# Patient Record
Sex: Female | Born: 1955 | Race: White | Hispanic: No | Smoking: Never smoker
Health system: Southern US, Community
[De-identification: ages and names within clinical notes are randomized; demographics above are authoritative.]

## PROBLEM LIST (undated history)

## (undated) DIAGNOSIS — F419 Anxiety disorder, unspecified: Secondary | ICD-10-CM

## (undated) DIAGNOSIS — T8859XA Other complications of anesthesia, initial encounter: Secondary | ICD-10-CM

## (undated) DIAGNOSIS — R112 Nausea with vomiting, unspecified: Secondary | ICD-10-CM

## (undated) DIAGNOSIS — Z8489 Family history of other specified conditions: Secondary | ICD-10-CM

## (undated) DIAGNOSIS — I10 Essential (primary) hypertension: Secondary | ICD-10-CM

## (undated) DIAGNOSIS — E119 Type 2 diabetes mellitus without complications: Secondary | ICD-10-CM

## (undated) DIAGNOSIS — Z9889 Other specified postprocedural states: Secondary | ICD-10-CM

## (undated) DIAGNOSIS — C801 Malignant (primary) neoplasm, unspecified: Secondary | ICD-10-CM

## (undated) DIAGNOSIS — D649 Anemia, unspecified: Secondary | ICD-10-CM

## (undated) DIAGNOSIS — M1990 Unspecified osteoarthritis, unspecified site: Secondary | ICD-10-CM

## (undated) HISTORY — PX: TUBAL LIGATION: SHX77

## (undated) HISTORY — PX: CHOLECYSTECTOMY: SHX55

## (undated) HISTORY — PX: ABDOMINAL HYSTERECTOMY: SHX81

---

## 2014-10-16 ENCOUNTER — Emergency Department
Admission: EM | Admit: 2014-10-16 | Discharge: 2014-10-16 | Disposition: A | Discharge status: Home / Self Care | Payer: Self-pay | Attending: Student | Admitting: Student

## 2014-10-16 ENCOUNTER — Emergency Department: Payer: Self-pay

## 2014-10-16 ENCOUNTER — Encounter: Payer: Self-pay | Admitting: Emergency Medicine

## 2014-10-16 DIAGNOSIS — Z7982 Long term (current) use of aspirin: Secondary | ICD-10-CM | POA: Insufficient documentation

## 2014-10-16 DIAGNOSIS — M10072 Idiopathic gout, left ankle and foot: Secondary | ICD-10-CM | POA: Insufficient documentation

## 2014-10-16 LAB — CBC WITH DIFFERENTIAL/PLATELET
BASOS ABS: 0.1 10*3/uL (ref 0–0.1)
BASOS PCT: 1 %
Eosinophils Absolute: 0.4 10*3/uL (ref 0–0.7)
Eosinophils Relative: 5 %
HCT: 39 % (ref 35.0–47.0)
HEMOGLOBIN: 12.5 g/dL (ref 12.0–16.0)
LYMPHS PCT: 29 %
Lymphs Abs: 2.1 10*3/uL (ref 1.0–3.6)
MCH: 27.8 pg (ref 26.0–34.0)
MCHC: 31.9 g/dL — ABNORMAL LOW (ref 32.0–36.0)
MCV: 87 fL (ref 80.0–100.0)
Monocytes Absolute: 0.5 10*3/uL (ref 0.2–0.9)
Monocytes Relative: 7 %
Neutro Abs: 4.1 10*3/uL (ref 1.4–6.5)
Neutrophils Relative %: 58 %
Platelets: 252 10*3/uL (ref 150–440)
RBC: 4.48 MIL/uL (ref 3.80–5.20)
RDW: 14.6 % — ABNORMAL HIGH (ref 11.5–14.5)
WBC: 7.1 10*3/uL (ref 3.6–11.0)

## 2014-10-16 LAB — BASIC METABOLIC PANEL
ANION GAP: 9 (ref 5–15)
BUN: 18 mg/dL (ref 6–20)
CHLORIDE: 103 mmol/L (ref 101–111)
CO2: 30 mmol/L (ref 22–32)
Calcium: 8.9 mg/dL (ref 8.9–10.3)
Creatinine, Ser: 0.83 mg/dL (ref 0.44–1.00)
GFR calc non Af Amer: >60 mL/min (ref >60)
GLUCOSE: 133 mg/dL — AB (ref 65–99)
Potassium: 4.1 mmol/L (ref 3.5–5.1)
Sodium: 142 mmol/L (ref 135–145)

## 2014-10-16 LAB — URIC ACID: Uric Acid, Serum: 9.3 mg/dL — ABNORMAL HIGH (ref 2.3–6.6)

## 2014-10-16 MED ORDER — COLCHICINE 0.6 MG PO TABS: 0.6000 mg | ORAL_TABLET | Freq: Two times a day (BID) | ORAL | Status: DC

## 2014-10-16 MED ORDER — INDOMETHACIN 50 MG PO CAPS
50.0000 mg | ORAL_CAPSULE | Freq: Two times a day (BID) | ORAL | Status: AC
Start: 2014-10-16 — End: 2014-10-20

## 2014-10-16 MED ORDER — HYDROCODONE-ACETAMINOPHEN 5-325 MG PO TABS
1.0000 | ORAL_TABLET | Freq: Once | ORAL | Status: AC
  Administered 2014-10-16: 1 via ORAL

## 2014-10-16 MED ORDER — HYDROCODONE-ACETAMINOPHEN 5-325 MG PO TABS
ORAL_TABLET | ORAL | Status: AC
  Filled 2014-10-16: qty 1

## 2014-10-16 MED ORDER — KETOROLAC TROMETHAMINE 10 MG PO TABS
ORAL_TABLET | ORAL | Status: AC
  Filled 2014-10-16: qty 2

## 2014-10-16 MED ORDER — TRAMADOL HCL 50 MG PO TABS
ORAL_TABLET | ORAL | Status: AC
  Filled 2014-10-16: qty 1

## 2014-10-16 MED ORDER — TRAMADOL HCL 50 MG PO TABS
50.0000 mg | ORAL_TABLET | Freq: Once | ORAL | Status: AC
  Administered 2014-10-16: 50 mg via ORAL

## 2014-10-16 MED ORDER — KETOROLAC TROMETHAMINE 10 MG PO TABS
20.0000 mg | ORAL_TABLET | Freq: Once | ORAL | Status: AC
  Administered 2014-10-16: 20 mg via ORAL

## 2014-10-16 NOTE — ED Provider Notes (Signed)
[facility] Emergency Department Provider Note  ____________________________________________  Time seen: Approximately 9:09 PM  I have reviewed the triage vital signs and the nursing notes.   HISTORY  Chief Complaint Foot Pain and Ankle Pain    HPI Felicia Lane is a 59 y.o. female complaining the left foot and ankle pain for 3-4 days without any provocative incident. Patient stated the pain has increased over the last 2 days along with the swelling. Initially the pain was controlled with naproxen but today that did not work. She denies any fever or chills to this complaint.   History reviewed. No pertinent past medical history.  There are no active problems to display for this patient.   Past Surgical History  Procedure Laterality Date  . Abdominal hysterectomy    . Cholecystectomy      Current Outpatient Rx  Name  Route  Sig  Dispense  Refill  . aspirin 81 MG tablet   Oral   Take 81 mg by mouth daily.           Allergies Codeine  No family history on file.  Social History History  Substance Use Topics  . Smoking status: Never Smoker   . Smokeless tobacco: Never Used  . Alcohol Use: Yes     Comment: occasionally    Review of Systems  Constitutional: No fever/chills Eyes: No visual changes. ENT: No sore throat. Cardiovascular: Denies chest pain. Respiratory: Denies shortness of breath. Gastrointestinal: No abdominal pain.  No nausea, no vomiting.  No diarrhea.  No constipation. Genitourinary: Negative for dysuria. Musculoskeletal: Positive for ankle and foot pain stayed most of the pain is in the ball of the left foot. Skin: Negative for rash. Positive for edema  10-point ROS otherwise negative.  ____________________________________________   PHYSICAL EXAM:  VITAL SIGNS: ED Triage Vitals  Enc Vitals Group     BP 10/16/14 1929 156/84 mmHg     Pulse Rate 10/16/14 1929 79     Resp 10/16/14 1929 18     Temp 10/16/14  1929 98.4 F (36.9 C)     Temp Source 10/16/14 1929 Oral     SpO2 10/16/14 1929 96 %     Weight 10/16/14 1929 280 lb (127.007 kg)     Height 10/16/14 1929 5\' 6"  (1.676 m)     Head Cir --      Peak Flow --      Pain Score 10/16/14 1930 10     Pain Loc --      Pain Edu? --      Excl. in [place]? --     Constitutional: Alert and oriented. Well appearing and in moderate distress distress. Eyes: Conjunctivae are normal. PERRL. EOMI. Head: Atraumatic. Nose: No congestion/rhinnorhea. Mouth/Throat: Mucous membranes are moist.  Oropharynx non-erythematous. Neck: No stridor.  Full range of motion of the neck Lymphatic/Immunilogical: No cervical lymphadenopathy. Cardiovascular: Normal rate, regular rhythm. Grossly normal heart sounds.  Good peripheral circulation. Blood pressure slightly elevated Respiratory: Normal respiratory effort.  No retractions. Lungs CTAB. Gastrointestinal: Soft and nontender. No distention. No abdominal bruits. No CVA tenderness. Musculoskeletal: Left lower lower extremity tenderness and edema confined to the ankle and foot  No joint effusions. Neurologic:  Normal speech and language. No gross focal neurologic deficits are appreciated. Speech is normal. No gait instability. Skin:  Skin is warm, dry and intact. No rash noted. Psychiatric: Mood and affect are normal. Speech and behavior are normal.  ____________________________________________   LABS (all labs ordered are  listed, but only abnormal results are displayed)  Labs Reviewed  CBC WITH DIFFERENTIAL/PLATELET - Abnormal; Notable for the following:    MCHC 31.9 (*)    RDW 14.6 (*)    All other components within normal limits  BASIC METABOLIC PANEL - Abnormal; Notable for the following:    Glucose, Bld 133 (*)    All other components within normal limits  URIC ACID - Abnormal; Notable for the following:    Uric Acid, Serum 9.3 (*)    All other components within normal limits   _______________ Labs Reviewed   CBC WITH DIFFERENTIAL/PLATELET - Abnormal; Notable for the following:    MCHC 31.9 (*)    RDW 14.6 (*)    All other components within normal limits  BASIC METABOLIC PANEL - Abnormal; Notable for the following:    Glucose, Bld 133 (*)    All other components within normal limits  URIC ACID - Abnormal; Notable for the following:    Uric Acid, Serum 9.3 (*)    All other components within normal limits   _____________________________  EKG   ____________________________________________  RADIOLOGY  No acute findings ____________________________________________   PROCEDURES  Procedure(s) performed: None  Critical Care performed: No  ____________________________________________   INITIAL IMPRESSION / ASSESSMENT AND PLAN / ED COURSE  Pertinent labs & imaging results that were available during my care of the patient were reviewed by me and considered in my medical decision making (see chart for details).  Left foot pain ____________________________________________   FINAL CLINICAL IMPRESSION(S) / ED DIAGNOSES  Final diagnoses:  Acute idiopathic gout of left ankle      Sable Feil, PA-C 10/16/14 2251

## 2014-10-16 NOTE — ED Notes (Signed)
Pt c/o left ankle and foot pain for 3-4 days; denies injury; pt is concerned she has gout

## 2014-10-16 NOTE — Discharge Instructions (Signed)
Gout °Gout is when your joints become red, sore, and swell (inflamed). This is caused by the buildup of uric acid crystals in the joints. Uric acid is a chemical that is normally in the blood. If the level of uric acid gets too high in the blood, these crystals form in your joints and tissues. Over time, these crystals can form into masses near the joints and tissues. These masses can destroy bone and cause the bone to look misshapen (deformed). °HOME CARE  °· Do not take aspirin for pain. °· Only take medicine as told by your doctor. °· Rest the joint as much as you can. When in bed, keep sheets and blankets off painful areas. °· Keep the sore joints raised (elevated). °· Put warm or cold packs on painful joints. Use of warm or cold packs depends on which works best for you. °· Use crutches if the painful joint is in your leg. °· Drink enough fluids to keep your pee (urine) clear or pale yellow. Limit alcohol, sugary drinks, and drinks with fructose in them. °· Follow your diet instructions. Pay careful attention to how much protein you eat. Include fruits, vegetables, whole grains, and fat-free or low-fat milk products in your daily diet. Talk to your doctor or dietitian about the use of coffee, vitamin C, and cherries. These may help lower uric acid levels. °· Keep a healthy body weight. °GET HELP RIGHT AWAY IF:  °· You have watery poop (diarrhea), throw up (vomit), or have any side effects from medicines. °· You do not feel better in 24 hours, or you are getting worse. °· Your joint becomes suddenly more tender, and you have chills or a fever. °MAKE SURE YOU:  °· Understand these instructions. °· Will watch your condition. °· Will get help right away if you are not doing well or get worse. °Document Released: 02/27/2008 Document Revised: 10/04/2013 Document Reviewed: 01/01/2012 °ExitCare® Patient Information ©2015 [employer]. This information is not intended to replace advice given to you by your health care  provider. Make sure you discuss any questions you have with your health care provider. ° °

## 2014-10-20 ENCOUNTER — Ambulatory Visit: Payer: Self-pay

## 2014-11-24 ENCOUNTER — Ambulatory Visit: Payer: Self-pay

## 2014-12-01 ENCOUNTER — Other Ambulatory Visit: Payer: Self-pay

## 2014-12-15 ENCOUNTER — Ambulatory Visit: Payer: Self-pay | Admitting: Ophthalmology

## 2014-12-15 ENCOUNTER — Ambulatory Visit: Payer: Self-pay

## 2015-01-17 ENCOUNTER — Ambulatory Visit: Payer: Self-pay

## 2015-01-19 ENCOUNTER — Other Ambulatory Visit: Payer: Self-pay

## 2015-03-23 ENCOUNTER — Other Ambulatory Visit: Payer: Self-pay

## 2015-03-30 ENCOUNTER — Ambulatory Visit: Payer: Self-pay

## 2015-12-21 ENCOUNTER — Ambulatory Visit: Payer: Self-pay | Admitting: Ophthalmology

## 2018-07-08 ENCOUNTER — Ambulatory Visit: Payer: Self-pay | Attending: Oncology | Admitting: *Deleted

## 2018-07-08 ENCOUNTER — Ambulatory Visit
Admission: RE | Admit: 2018-07-08 | Discharge: 2018-07-08 | Disposition: A | Discharge status: Home / Self Care | Payer: Self-pay | Source: Ambulatory Visit | Attending: Oncology | Admitting: Oncology

## 2018-07-08 ENCOUNTER — Encounter (INDEPENDENT_AMBULATORY_CARE_PROVIDER_SITE_OTHER): Payer: Self-pay

## 2018-07-08 ENCOUNTER — Encounter: Payer: Self-pay | Admitting: *Deleted

## 2018-07-08 VITALS — BP 149/80 | HR 77 | Temp 98.4°F | Ht 66.75 in | Wt 281.4 lb

## 2018-07-08 DIAGNOSIS — Z0000 Encounter for general adult medical examination without abnormal findings: Secondary | ICD-10-CM

## 2018-07-08 NOTE — Patient Instructions (Signed)
Gave patient hand-out, Women Staying Healthy, Active and Well from [facility], with education on breast health, pap smears, heart and colon health. 

## 2018-07-08 NOTE — Progress Notes (Signed)
  Subjective:     Patient ID: Felicia Lane, female   DOB: [birth year], 63 y.o.   MRN: [id number]  HPI   Review of Systems     Objective:   Physical Exam Chest:     Breasts:        Right: No swelling, bleeding, inverted nipple, mass, nipple discharge, skin change or tenderness.        Left: No bleeding, inverted nipple, mass, nipple discharge, skin change or tenderness.  Abdominal:     Palpations: There is no hepatomegaly or splenomegaly.    Genitourinary:    Exam position: Lithotomy position.     Labia:        Right: No rash, tenderness, lesion or injury.        Left: No rash, tenderness, lesion or injury.      Urethra: No urethral lesion.     Uterus: Absent.   Lymphadenopathy:     Upper Body:     Right upper body: No supraclavicular or axillary adenopathy.     Left upper body: No supraclavicular adenopathy.        Assessment:     63 year old White female presents to [facility] for clinical breast exam and mammogram.  States she has not had a mammogram since 1995.  Clinical breast exam is unremarkable.  Taught self breast awareness.  States she has had a hysterectomy for heavy bleeding and clotting.  She does not know if she has a cervix.  On examination of the vagina and pelvis there is no cervix visualized or palpated.  States she does still have both ovaries.  Patient has been screened for eligibility.  She does not have any insurance, Medicare or Medicaid.  She also meets financial eligibility.  Hand-out given on the Affordable Care Act.  Risk Assessment    Risk Scores      07/08/2018   Last edited by: Orson Slick, CMA   5-year risk: 1.3 %   Lifetime risk: 5.2 %            Plan:     Screening mammogram ordered.  Will follow-up per [facility] protocol.

## 2018-07-20 ENCOUNTER — Encounter: Payer: Self-pay | Admitting: *Deleted

## 2018-07-20 NOTE — Progress Notes (Unsigned)
Letter mailed from the [facility] to inform patient of her normal mammogram results.  Patient is to follow-up with annual screening in one year.  HSIS to Christy. 

## 2019-01-01 ENCOUNTER — Other Ambulatory Visit: Payer: Self-pay | Admitting: Family Medicine

## 2019-01-01 DIAGNOSIS — Z0000 Encounter for general adult medical examination without abnormal findings: Secondary | ICD-10-CM

## 2019-01-27 ENCOUNTER — Ambulatory Visit
Admission: RE | Admit: 2019-01-27 | Discharge: 2019-01-27 | Disposition: A | Discharge status: Home / Self Care | Payer: Medicare HMO | Source: Ambulatory Visit | Attending: Family Medicine | Admitting: Family Medicine

## 2019-01-27 DIAGNOSIS — M8588 Other specified disorders of bone density and structure, other site: Secondary | ICD-10-CM | POA: Insufficient documentation

## 2019-01-27 DIAGNOSIS — E119 Type 2 diabetes mellitus without complications: Secondary | ICD-10-CM | POA: Insufficient documentation

## 2019-01-27 DIAGNOSIS — I10 Essential (primary) hypertension: Secondary | ICD-10-CM | POA: Insufficient documentation

## 2019-01-27 DIAGNOSIS — Z0000 Encounter for general adult medical examination without abnormal findings: Secondary | ICD-10-CM | POA: Insufficient documentation

## 2019-12-31 DIAGNOSIS — M19011 Primary osteoarthritis, right shoulder: Secondary | ICD-10-CM | POA: Insufficient documentation

## 2020-01-03 ENCOUNTER — Other Ambulatory Visit: Payer: Self-pay | Admitting: Surgery

## 2020-01-03 DIAGNOSIS — M19011 Primary osteoarthritis, right shoulder: Secondary | ICD-10-CM

## 2020-01-05 ENCOUNTER — Ambulatory Visit
Admission: RE | Admit: 2020-01-05 | Discharge: 2020-01-05 | Disposition: A | Discharge status: Home / Self Care | Payer: Medicare HMO | Source: Ambulatory Visit | Attending: Surgery | Admitting: Surgery

## 2020-01-05 ENCOUNTER — Other Ambulatory Visit: Payer: Self-pay

## 2020-01-05 DIAGNOSIS — M19011 Primary osteoarthritis, right shoulder: Secondary | ICD-10-CM | POA: Diagnosis present

## 2020-01-26 ENCOUNTER — Other Ambulatory Visit: Payer: Self-pay | Admitting: Family Medicine

## 2020-01-26 DIAGNOSIS — Z1231 Encounter for screening mammogram for malignant neoplasm of breast: Secondary | ICD-10-CM

## 2020-02-16 ENCOUNTER — Other Ambulatory Visit: Payer: Self-pay | Admitting: Surgery

## 2020-02-25 ENCOUNTER — Encounter
Admission: RE | Admit: 2020-02-25 | Discharge: 2020-02-25 | Disposition: A | Discharge status: Home / Self Care | Payer: Medicare HMO | Source: Ambulatory Visit | Attending: Surgery | Admitting: Surgery

## 2020-02-25 ENCOUNTER — Other Ambulatory Visit: Payer: Self-pay

## 2020-02-25 DIAGNOSIS — Z01818 Encounter for other preprocedural examination: Secondary | ICD-10-CM | POA: Insufficient documentation

## 2020-02-25 DIAGNOSIS — E119 Type 2 diabetes mellitus without complications: Secondary | ICD-10-CM | POA: Insufficient documentation

## 2020-02-25 DIAGNOSIS — I10 Essential (primary) hypertension: Secondary | ICD-10-CM | POA: Insufficient documentation

## 2020-02-25 HISTORY — DX: Family history of other specified conditions: Z84.89

## 2020-02-25 HISTORY — DX: Type 2 diabetes mellitus without complications: E11.9

## 2020-02-25 HISTORY — DX: Unspecified osteoarthritis, unspecified site: M19.90

## 2020-02-25 HISTORY — DX: Malignant (primary) neoplasm, unspecified: C80.1

## 2020-02-25 HISTORY — DX: Anxiety disorder, unspecified: F41.9

## 2020-02-25 HISTORY — DX: Anemia, unspecified: D64.9

## 2020-02-25 HISTORY — DX: Nausea with vomiting, unspecified: R11.2

## 2020-02-25 HISTORY — DX: Other specified postprocedural states: Z98.890

## 2020-02-25 HISTORY — DX: Other complications of anesthesia, initial encounter: T88.59XA

## 2020-02-25 HISTORY — DX: Essential (primary) hypertension: I10

## 2020-02-25 LAB — BASIC METABOLIC PANEL
Anion gap: 10 (ref 5–15)
BUN: 41 mg/dL — ABNORMAL HIGH (ref 8–23)
CO2: 25 mmol/L (ref 22–32)
Calcium: 9 mg/dL (ref 8.9–10.3)
Chloride: 105 mmol/L (ref 98–111)
Creatinine, Ser: 1.81 mg/dL — ABNORMAL HIGH (ref 0.44–1.00)
GFR calc Af Amer: 33 mL/min — ABNORMAL LOW (ref >60)
GFR calc non Af Amer: 29 mL/min — ABNORMAL LOW (ref >60)
Glucose, Bld: 124 mg/dL — ABNORMAL HIGH (ref 70–99)
Potassium: 4.6 mmol/L (ref 3.5–5.1)
Sodium: 140 mmol/L (ref 135–145)

## 2020-02-25 LAB — CBC
HCT: 32.8 % — ABNORMAL LOW (ref 36.0–46.0)
Hemoglobin: 10.2 g/dL — ABNORMAL LOW (ref 12.0–15.0)
MCH: 29.3 pg (ref 26.0–34.0)
MCHC: 31.1 g/dL (ref 30.0–36.0)
MCV: 94.3 fL (ref 80.0–100.0)
Platelets: 264 10*3/uL (ref 150–400)
RBC: 3.48 MIL/uL — ABNORMAL LOW (ref 3.87–5.11)
RDW: 14.7 % (ref 11.5–15.5)
WBC: 11.3 10*3/uL — ABNORMAL HIGH (ref 4.0–10.5)
nRBC: 0 % (ref 0.0–0.2)

## 2020-02-25 LAB — TYPE AND SCREEN
ABO/RH(D): A POS
Antibody Screen: NEGATIVE

## 2020-02-25 LAB — SURGICAL PCR SCREEN
MRSA, PCR: NEGATIVE
Staphylococcus aureus: NEGATIVE

## 2020-02-25 LAB — HEMOGLOBIN A1C
Hgb A1c MFr Bld: 7 % — ABNORMAL HIGH (ref 4.8–5.6)
Mean Plasma Glucose: 154.2 mg/dL

## 2020-02-25 NOTE — Patient Instructions (Addendum)
Your procedure is scheduled on:03-02-20 THURSDAY Report to Day Surgery on the 2nd floor of the [facility]. To find out your arrival time, please call [phone] between 1PM - 3PM on:03-01-20 WEDNESDAY  REMEMBER: Instructions that are not followed completely may result in serious medical risk, up to and including death; or upon the discretion of your surgeon and anesthesiologist your surgery may need to be rescheduled.  Do not eat food after midnight the night before surgery.  No gum chewing, lozengers or hard candies.  You may however, drink WATER up to 2 hours before you are scheduled to arrive for your surgery. Do not drink anything within 2 hours of your scheduled arrival time.  Type 1 and Type 2 diabetics should only drink water.  In addition, your doctor has ordered for you to drink the provided  Gatorade G2 Drinking this carbohydrate drink up to two hours before surgery helps to reduce insulin resistance and improve patient outcomes. Please complete drinking 2 hours prior to scheduled arrival time.  TAKE THESE MEDICATIONS THE MORNING OF SURGERY WITH A SIP OF WATER: -GABAPENTIN (NEURONTIN)  Stop Metformin 2 days prior to surgery-LAST DOSE ON 02-28-20 MONDAY  Follow recommendations from Cardiologist, Pulmonologist or PCP regarding stopping Aspirin, Coumadin, Plavix, Eliquis, Pradaxa, or Pletal-STOP YOUR ASPIRIN NOW  One week prior to surgery: Stop Anti-inflammatories (NSAIDS) such as MOBIC (MELOXICAM) Advil, Aleve, Ibuprofen, Motrin, Naproxen, Naprosyn and Aspirin based products such as Excedrin, Goodys Powder, BC Powder.  Stop ANY OVER THE COUNTER supplements until after surgery. (You may continue taking Tylenol, Vitamin D, Vitamin B, and multivitamin.)-STOP YOUR GLUCOSAMINE-CHONDROITIN AND FISH OIL NOW-YOU  MAY RESUME AFTER SURGERY  No Alcohol for 24 hours before or after surgery.  No Smoking including e-cigarettes for 24 hours prior to surgery.  No chewable tobacco  products for at least 6 hours prior to surgery.  No nicotine patches on the day of surgery.  Do not use any "recreational" drugs for at least a week prior to your surgery.  Please be advised that the combination of cocaine and anesthesia may have negative outcomes, up to and including death. If you test positive for cocaine, your surgery will be cancelled.  On the morning of surgery brush your teeth with toothpaste and water, you may rinse your mouth with mouthwash if you wish. Do not swallow any toothpaste or mouthwash.  Do not wear jewelry, make-up, hairpins, clips or nail polish.  Do not wear lotions, powders, or perfumes.   Do not shave 48 hours prior to surgery.   Contact lenses, hearing aids and dentures may not be worn into surgery.  Do not bring valuables to the hospital. [facility] is not responsible for any missing/lost belongings or valuables.   Use CHG Soap or wipes as directed on instruction sheet.  Total Shoulder Arthroplasty:  use Benzolyl Peroxide 5% Gel as directed on instruction sheet.  Notify your doctor if there is any change in your medical condition (cold, fever, infection).  Wear comfortable clothing (specific to your surgery type) to the hospital.  Plan for stool softeners for home use; pain medications have a tendency to cause constipation. You can also help prevent constipation by eating foods high in fiber such as fruits and vegetables and drinking plenty of fluids as your diet allows.  After surgery, you can help prevent lung complications by doing breathing exercises.  Take deep breaths and cough every 1-2 hours. Your doctor may order a device called an Incentive Spirometer to help you take  deep breaths. When coughing or sneezing, hold a pillow firmly against your incision with both hands. This is called splinting. Doing this helps protect your incision. It also decreases belly discomfort.  If you are being admitted to the hospital overnight, leave  your suitcase in the car. After surgery it may be brought to your room.  If you are being discharged the day of surgery, you will not be allowed to drive home. You will need a responsible adult (18 years or older) to drive you home and stay with you that night.   If you are taking public transportation, you will need to have a responsible adult (18 years or older) with you. Please confirm with your physician that it is acceptable to use public transportation.   Please call the [facility] at [phone] if you have any questions about these instructions.  Visitation Policy:  Patients undergoing a surgery or procedure may have one family member or support person with them as long as that person is not COVID-19 positive or experiencing its symptoms.  That person may remain in the waiting area during the procedure.  Inpatient Visitation Update:   In an effort to ensure the safety of our team members and our patients, we are implementing a change to our visitation policy:  Effective Monday, Aug. 9, at 7 a.m., inpatients will be allowed one support person.  o The support person may change daily.  o The support person must pass our screening, gel in and out, and wear a mask at all times, including in the patients room.  o Patients must also wear a mask when staff or their support person are in the room.  o Masking is required regardless of vaccination status.  Systemwide, no visitors 17 or younger.

## 2020-02-25 NOTE — Progress Notes (Signed)
  [facility] Perioperative Services: Pre-Admission/Anesthesia Testing  Abnormal Lab Notification   Date: 02/25/20  Name: Felicia Lane MRN:   [id number]  Re: Abnormal labs noted during PAT appointment   Provider(s) Notified: Poggi, Marshall Cork, MD and Cameron Proud, PA-C Notification mode: Routed and/or faxed via [place] LAB VALUE(S): Lab Results  Component Value Date   HGB 10.2 (L) 02/25/2020   HCT 32.8 (L) 02/25/2020   BUN 41 (H) 02/25/2020   CREATININE 1.81 (H) 02/25/2020    Notes: Patient is on daily thiazide (HCTZ) and loop (furosemide) diuretics. Patient also with a T2DM diagnosis. Last glucose on record was 247 mg/dL in 02/2018. She is currently on oral (glipizide, metformin, and sitagliptin) therapy.  I do not have any available Hgb A1c results for review. Will add an A1c to today's PAT labs. In the event that the result is elevated, I will forward the result to you and her PCP for review. With her HTN and T2DM, patient is on a renal protective ACEi (lisinopril). This communication is being sent in order to determine if patient is deemed to have adequate preoperative glycemic control in efforts to reduce her risk of developing SSI/PJI.   Marland Kitchen The odds ratio for SSI/PJI infection is between 2.8 and 3.4 for orthopedic surgery patients with pre-operative serum glucose levels of > 125 mg/dL or a post-operative levels of > 200 mg/dL ([place], 2019).    . Data suggests that a Hgb A1c threshold of 7.7% tends to be more indicative of infection than the commonly used 7% and should perhaps be the pre-operative patient optimization goal (Tarabichi et al., 2017).   With that being said, the benefit of improving glycemic control must be weighed against the overall risk associated with delaying a necessary elective orthopedic surgery for this patient. This is a Community education officer; no formal response is required.  Citations: Charlett Blake, A.F. Reducing the  risk of infection after total joint arthroplasty: preoperative optimization. Arthroplasty 1, 4 (2019). [email or web address]  Lorrin Goodell MM, [place], Brigati D, [place] SM, [address] [place] JC, [place], [place], [place], Parvizi Lenna Sciara Peebles. Determining the Threshold for HbA1c as a Predictor for Adverse Outcomes After Total Joint Arthroplasty: A Multicenter, Retrospective Study. J Arthroplasty. 2017 Sep;32(9S):S263-S267.e1. [email or web address].   Honor Loh, MSN, APRN, FNP-C, CEN [facility]  Peri-operative Services Nurse Practitioner Phone: [phone] 02/25/20 2:59 PM

## 2020-02-29 ENCOUNTER — Other Ambulatory Visit: Payer: Self-pay

## 2020-02-29 ENCOUNTER — Other Ambulatory Visit
Admission: RE | Admit: 2020-02-29 | Discharge: 2020-02-29 | Disposition: A | Discharge status: Home / Self Care | Payer: Medicare HMO | Source: Ambulatory Visit | Attending: Surgery | Admitting: Surgery

## 2020-02-29 DIAGNOSIS — Z20822 Contact with and (suspected) exposure to covid-19: Secondary | ICD-10-CM | POA: Diagnosis not present

## 2020-02-29 DIAGNOSIS — Z01812 Encounter for preprocedural laboratory examination: Secondary | ICD-10-CM | POA: Insufficient documentation

## 2020-02-29 LAB — SARS CORONAVIRUS 2 (TAT 6-24 HRS): SARS Coronavirus 2: NEGATIVE

## 2020-03-01 ENCOUNTER — Encounter: Payer: Self-pay | Admitting: Surgery

## 2020-03-02 ENCOUNTER — Ambulatory Visit: Payer: Medicare HMO

## 2020-03-02 ENCOUNTER — Other Ambulatory Visit: Payer: Self-pay

## 2020-03-02 ENCOUNTER — Encounter
Admission: RE | Disposition: A | Discharge status: Home / Self Care | Payer: Self-pay | Source: Home / Self Care | Attending: Surgery

## 2020-03-02 ENCOUNTER — Ambulatory Visit: Payer: Medicare HMO | Admitting: Certified Registered"

## 2020-03-02 ENCOUNTER — Ambulatory Visit
Admission: RE | Admit: 2020-03-02 | Discharge: 2020-03-02 | Disposition: A | Discharge status: Home / Self Care | Payer: Medicare HMO | Attending: Surgery | Admitting: Surgery

## 2020-03-02 ENCOUNTER — Encounter: Payer: Self-pay | Admitting: Surgery

## 2020-03-02 DIAGNOSIS — E669 Obesity, unspecified: Secondary | ICD-10-CM | POA: Insufficient documentation

## 2020-03-02 DIAGNOSIS — M75121 Complete rotator cuff tear or rupture of right shoulder, not specified as traumatic: Secondary | ICD-10-CM | POA: Diagnosis not present

## 2020-03-02 DIAGNOSIS — Z7984 Long term (current) use of oral hypoglycemic drugs: Secondary | ICD-10-CM | POA: Diagnosis not present

## 2020-03-02 DIAGNOSIS — Z7982 Long term (current) use of aspirin: Secondary | ICD-10-CM | POA: Insufficient documentation

## 2020-03-02 DIAGNOSIS — Z596 Low income: Secondary | ICD-10-CM | POA: Diagnosis not present

## 2020-03-02 DIAGNOSIS — Z6841 Body Mass Index (BMI) 40.0 and over, adult: Secondary | ICD-10-CM | POA: Diagnosis not present

## 2020-03-02 DIAGNOSIS — Z791 Long term (current) use of non-steroidal anti-inflammatories (NSAID): Secondary | ICD-10-CM | POA: Insufficient documentation

## 2020-03-02 DIAGNOSIS — Z885 Allergy status to narcotic agent status: Secondary | ICD-10-CM | POA: Diagnosis not present

## 2020-03-02 DIAGNOSIS — Z79899 Other long term (current) drug therapy: Secondary | ICD-10-CM | POA: Insufficient documentation

## 2020-03-02 DIAGNOSIS — M19011 Primary osteoarthritis, right shoulder: Secondary | ICD-10-CM | POA: Diagnosis present

## 2020-03-02 DIAGNOSIS — Z96611 Presence of right artificial shoulder joint: Secondary | ICD-10-CM

## 2020-03-02 DIAGNOSIS — Z01812 Encounter for preprocedural laboratory examination: Secondary | ICD-10-CM

## 2020-03-02 HISTORY — PX: REVERSE SHOULDER ARTHROPLASTY: SHX5054

## 2020-03-02 LAB — ABO/RH: ABO/RH(D): A POS

## 2020-03-02 LAB — GLUCOSE, CAPILLARY
Glucose-Capillary: 175 mg/dL — ABNORMAL HIGH (ref 70–99)
Glucose-Capillary: 190 mg/dL — ABNORMAL HIGH (ref 70–99)

## 2020-03-02 SURGERY — ARTHROPLASTY, SHOULDER, TOTAL, REVERSE
Anesthesia: General | Site: Shoulder | Laterality: Right

## 2020-03-02 MED ORDER — BUPIVACAINE-EPINEPHRINE (PF) 0.5% -1:200000 IJ SOLN
INTRAMUSCULAR | Status: DC | PRN
  Administered 2020-03-02: 30 mL

## 2020-03-02 MED ORDER — MIDAZOLAM HCL 2 MG/2ML IJ SOLN
INTRAMUSCULAR | Status: AC
  Administered 2020-03-02: 2 mg via INTRAVENOUS
  Filled 2020-03-02: qty 2

## 2020-03-02 MED ORDER — DEXTROSE 5 % IV SOLN
3.0000 g | INTRAVENOUS | Status: AC
  Administered 2020-03-02: 2 g via INTRAVENOUS
  Filled 2020-03-02: qty 3

## 2020-03-02 MED ORDER — ONDANSETRON HCL 4 MG/2ML IJ SOLN: 4.0000 mg | Freq: Once | INTRAMUSCULAR | Status: DC | PRN

## 2020-03-02 MED ORDER — LIDOCAINE HCL (PF) 2 % IJ SOLN
INTRAMUSCULAR | Status: AC
  Filled 2020-03-02: qty 5

## 2020-03-02 MED ORDER — SODIUM CHLORIDE 0.9 % IV SOLN
INTRAVENOUS | Status: DC | PRN
  Administered 2020-03-02: 50 ug/min via INTRAVENOUS

## 2020-03-02 MED ORDER — BUPIVACAINE HCL (PF) 0.5 % IJ SOLN
INTRAMUSCULAR | Status: AC
  Filled 2020-03-02: qty 30

## 2020-03-02 MED ORDER — CHLORHEXIDINE GLUCONATE 0.12 % MT SOLN: 15.0000 mL | Freq: Once | OROMUCOSAL | Status: AC

## 2020-03-02 MED ORDER — PROPOFOL 10 MG/ML IV BOLUS
INTRAVENOUS | Status: DC | PRN
  Administered 2020-03-02: 50 mg via INTRAVENOUS
  Administered 2020-03-02: 30 mg via INTRAVENOUS
  Administered 2020-03-02: 150 mg via INTRAVENOUS
  Administered 2020-03-02 (×2): 50 mg via INTRAVENOUS

## 2020-03-02 MED ORDER — MIDAZOLAM HCL 2 MG/2ML IJ SOLN
INTRAMUSCULAR | Status: AC
  Filled 2020-03-02: qty 2

## 2020-03-02 MED ORDER — TRANEXAMIC ACID 1000 MG/10ML IV SOLN
INTRAVENOUS | Status: AC
  Filled 2020-03-02: qty 10

## 2020-03-02 MED ORDER — LIDOCAINE HCL (PF) 1 % IJ SOLN
INTRAMUSCULAR | Status: DC | PRN
  Administered 2020-03-02: 5 mL via SUBCUTANEOUS

## 2020-03-02 MED ORDER — FAMOTIDINE 20 MG PO TABS
ORAL_TABLET | ORAL | Status: AC
  Administered 2020-03-02: 20 mg
  Filled 2020-03-02: qty 1

## 2020-03-02 MED ORDER — PHENYLEPHRINE HCL (PRESSORS) 10 MG/ML IV SOLN
INTRAVENOUS | Status: AC
  Filled 2020-03-02: qty 1

## 2020-03-02 MED ORDER — PROPOFOL 500 MG/50ML IV EMUL
INTRAVENOUS | Status: AC
  Filled 2020-03-02: qty 50

## 2020-03-02 MED ORDER — ROCURONIUM BROMIDE 100 MG/10ML IV SOLN
INTRAVENOUS | Status: DC | PRN
  Administered 2020-03-02: 20 mg via INTRAVENOUS
  Administered 2020-03-02: 50 mg via INTRAVENOUS

## 2020-03-02 MED ORDER — ORAL CARE MOUTH RINSE: 15.0000 mL | Freq: Once | OROMUCOSAL | Status: AC

## 2020-03-02 MED ORDER — FENTANYL CITRATE (PF) 100 MCG/2ML IJ SOLN
INTRAMUSCULAR | Status: AC
  Filled 2020-03-02: qty 2

## 2020-03-02 MED ORDER — SUGAMMADEX SODIUM 200 MG/2ML IV SOLN
INTRAVENOUS | Status: DC | PRN
  Administered 2020-03-02: 50 mg via INTRAVENOUS
  Administered 2020-03-02: 150 mg via INTRAVENOUS

## 2020-03-02 MED ORDER — DEXMEDETOMIDINE HCL 200 MCG/2ML IV SOLN
INTRAVENOUS | Status: DC | PRN
  Administered 2020-03-02 (×5): 4 ug via INTRAVENOUS

## 2020-03-02 MED ORDER — KETOROLAC TROMETHAMINE 15 MG/ML IJ SOLN
INTRAMUSCULAR | Status: AC
  Administered 2020-03-02: 15 mg via INTRAVENOUS
  Filled 2020-03-02: qty 1

## 2020-03-02 MED ORDER — BUPIVACAINE LIPOSOME 1.3 % IJ SUSP
INTRAMUSCULAR | Status: AC
  Filled 2020-03-02: qty 20

## 2020-03-02 MED ORDER — LIDOCAINE HCL (PF) 1 % IJ SOLN
INTRAMUSCULAR | Status: AC
  Filled 2020-03-02: qty 5

## 2020-03-02 MED ORDER — LIDOCAINE HCL (CARDIAC) PF 100 MG/5ML IV SOSY
PREFILLED_SYRINGE | INTRAVENOUS | Status: DC | PRN
  Administered 2020-03-02: 100 mg via INTRAVENOUS

## 2020-03-02 MED ORDER — ACETAMINOPHEN 10 MG/ML IV SOLN
INTRAVENOUS | Status: AC
  Filled 2020-03-02: qty 100

## 2020-03-02 MED ORDER — BUPIVACAINE HCL (PF) 0.5 % IJ SOLN
INTRAMUSCULAR | Status: AC
  Filled 2020-03-02: qty 10

## 2020-03-02 MED ORDER — BUPIVACAINE LIPOSOME 1.3 % IJ SUSP
INTRAMUSCULAR | Status: DC | PRN
  Administered 2020-03-02: 20 mL via PERINEURAL

## 2020-03-02 MED ORDER — BUPIVACAINE HCL (PF) 0.5 % IJ SOLN
INTRAMUSCULAR | Status: DC | PRN
  Administered 2020-03-02: 10 mL via PERINEURAL

## 2020-03-02 MED ORDER — PROPOFOL 10 MG/ML IV BOLUS
INTRAVENOUS | Status: AC
  Filled 2020-03-02: qty 40

## 2020-03-02 MED ORDER — FENTANYL CITRATE (PF) 100 MCG/2ML IJ SOLN
INTRAMUSCULAR | Status: DC | PRN
Start: 2020-03-02 — End: 2020-03-02
  Administered 2020-03-02: 50 ug via INTRAVENOUS
  Administered 2020-03-02 (×3): 25 ug via INTRAVENOUS
  Administered 2020-03-02: 50 ug via INTRAVENOUS

## 2020-03-02 MED ORDER — PHENYLEPHRINE HCL (PRESSORS) 10 MG/ML IV SOLN
INTRAVENOUS | Status: DC | PRN
  Administered 2020-03-02: 100 ug via INTRAVENOUS
  Administered 2020-03-02: 50 ug via INTRAVENOUS

## 2020-03-02 MED ORDER — ROCURONIUM BROMIDE 10 MG/ML (PF) SYRINGE
PREFILLED_SYRINGE | INTRAVENOUS | Status: AC
  Filled 2020-03-02: qty 10

## 2020-03-02 MED ORDER — ACETAMINOPHEN 10 MG/ML IV SOLN
INTRAVENOUS | Status: DC | PRN
  Administered 2020-03-02: 1000 mg via INTRAVENOUS

## 2020-03-02 MED ORDER — DEXAMETHASONE SODIUM PHOSPHATE 10 MG/ML IJ SOLN
INTRAMUSCULAR | Status: DC | PRN
  Administered 2020-03-02: 5 mg via INTRAVENOUS

## 2020-03-02 MED ORDER — MIDAZOLAM HCL 2 MG/2ML IJ SOLN: 2.0000 mg | Freq: Once | INTRAMUSCULAR | Status: AC

## 2020-03-02 MED ORDER — MIDAZOLAM HCL 5 MG/5ML IJ SOLN
INTRAMUSCULAR | Status: DC | PRN
  Administered 2020-03-02: 1 mg via INTRAVENOUS

## 2020-03-02 MED ORDER — SUCCINYLCHOLINE CHLORIDE 200 MG/10ML IV SOSY
PREFILLED_SYRINGE | INTRAVENOUS | Status: AC
  Filled 2020-03-02: qty 10

## 2020-03-02 MED ORDER — FENTANYL CITRATE (PF) 100 MCG/2ML IJ SOLN: 25.0000 ug | INTRAMUSCULAR | Status: DC | PRN

## 2020-03-02 MED ORDER — SODIUM CHLORIDE FLUSH 0.9 % IV SOLN
INTRAVENOUS | Status: AC
  Filled 2020-03-02: qty 40

## 2020-03-02 MED ORDER — PROPOFOL 500 MG/50ML IV EMUL
INTRAVENOUS | Status: DC | PRN
  Administered 2020-03-02: 150 ug/kg/min via INTRAVENOUS

## 2020-03-02 MED ORDER — KETOROLAC TROMETHAMINE 30 MG/ML IJ SOLN: 30.0000 mg | Freq: Once | INTRAMUSCULAR | Status: DC

## 2020-03-02 MED ORDER — ONDANSETRON HCL 4 MG/2ML IJ SOLN
INTRAMUSCULAR | Status: DC | PRN
  Administered 2020-03-02: 4 mg via INTRAVENOUS

## 2020-03-02 MED ORDER — OXYCODONE HCL 5 MG PO TABS: 5.0000 mg | ORAL_TABLET | ORAL | 0 refills | Status: DC | PRN

## 2020-03-02 MED ORDER — EPINEPHRINE PF 1 MG/ML IJ SOLN
INTRAMUSCULAR | Status: AC
  Filled 2020-03-02: qty 1

## 2020-03-02 MED ORDER — CHLORHEXIDINE GLUCONATE 0.12 % MT SOLN
OROMUCOSAL | Status: AC
  Administered 2020-03-02: 15 mL via OROMUCOSAL
  Filled 2020-03-02: qty 15

## 2020-03-02 MED ORDER — SODIUM CHLORIDE 0.9 % IV SOLN: INTRAVENOUS | Status: DC

## 2020-03-02 MED ORDER — TRANEXAMIC ACID 1000 MG/10ML IV SOLN
INTRAVENOUS | Status: DC | PRN
  Administered 2020-03-02: 1000 mg via TOPICAL

## 2020-03-02 MED ORDER — ONDANSETRON HCL 4 MG/2ML IJ SOLN
INTRAMUSCULAR | Status: AC
  Filled 2020-03-02: qty 2

## 2020-03-02 MED ORDER — KETOROLAC TROMETHAMINE 15 MG/ML IJ SOLN: 15.0000 mg | Freq: Once | INTRAMUSCULAR | Status: AC

## 2020-03-02 MED ORDER — DEXAMETHASONE SODIUM PHOSPHATE 10 MG/ML IJ SOLN
INTRAMUSCULAR | Status: AC
  Filled 2020-03-02: qty 1

## 2020-03-02 SURGICAL SUPPLY — 70 items
APL PRP STRL LF DISP 70% ISPRP (MISCELLANEOUS) ×1
BASEPLATE GLENOSPHERE 25 (Plate) ×2 IMPLANT
BEARING HUMERAL SHLDER 36M STD (Shoulder) ×1 IMPLANT
BIT DRILL TWIST 2.7 (BIT) ×2 IMPLANT
BLADE SAW SAG 25X90X1.19 (BLADE) ×2 IMPLANT
BRNG HUM STD 36 RVRS SHLDR (Shoulder) ×1 IMPLANT
CANISTER SUCT 1200ML W/VALVE (MISCELLANEOUS) ×2 IMPLANT
CANISTER SUCT 3000ML PPV (MISCELLANEOUS) ×4 IMPLANT
CHLORAPREP W/TINT 26 (MISCELLANEOUS) ×2 IMPLANT
COOLER POLAR GLACIER W/PUMP (MISCELLANEOUS) ×2 IMPLANT
COVER BACK TABLE REUSABLE LG (DRAPES) ×2 IMPLANT
COVER WAND RF STERILE (DRAPES) ×2 IMPLANT
DRAPE 3/4 80X56 (DRAPES) ×4 IMPLANT
DRAPE IMP U-DRAPE 54X76 (DRAPES) ×4 IMPLANT
DRAPE INCISE IOBAN 66X45 STRL (DRAPES) ×4 IMPLANT
DRSG OPSITE POSTOP 4X8 (GAUZE/BANDAGES/DRESSINGS) ×2 IMPLANT
ELECT BLADE 6.5 EXT (BLADE) IMPLANT
ELECT CAUTERY BLADE 6.4 (BLADE) ×2 IMPLANT
GLENOID SPHERE STD STRL 36MM (Orthopedic Implant) ×2 IMPLANT
GLOVE BIO SURGEON STRL SZ7.5 (GLOVE) ×8 IMPLANT
GLOVE BIO SURGEON STRL SZ8 (GLOVE) ×8 IMPLANT
GLOVE BIOGEL PI IND STRL 8 (GLOVE) ×1 IMPLANT
GLOVE BIOGEL PI INDICATOR 8 (GLOVE) ×1
GLOVE INDICATOR 8.0 STRL GRN (GLOVE) ×2 IMPLANT
GOWN STRL REUS W/ TWL LRG LVL3 (GOWN DISPOSABLE) ×1 IMPLANT
GOWN STRL REUS W/ TWL XL LVL3 (GOWN DISPOSABLE) ×1 IMPLANT
GOWN STRL REUS W/TWL LRG LVL3 (GOWN DISPOSABLE) ×2
GOWN STRL REUS W/TWL XL LVL3 (GOWN DISPOSABLE) ×2
HOOD PEEL AWAY FLYTE STAYCOOL (MISCELLANEOUS) ×6 IMPLANT
ILLUMINATOR WAVEGUIDE N/F (MISCELLANEOUS) ×2 IMPLANT
KIT STABILIZATION SHOULDER (MISCELLANEOUS) ×2 IMPLANT
KIT TURNOVER KIT A (KITS) ×2 IMPLANT
MASK FACE SPIDER DISP (MASK) ×2 IMPLANT
MAT ABSORB  FLUID 56X50 GRAY (MISCELLANEOUS) ×1
MAT ABSORB FLUID 56X50 GRAY (MISCELLANEOUS) ×1 IMPLANT
NDL SAFETY ECLIPSE 18X1.5 (NEEDLE) ×1 IMPLANT
NEEDLE HYPO 18GX1.5 SHARP (NEEDLE) ×2
NEEDLE HYPO 22GX1.5 SAFETY (NEEDLE) ×2 IMPLANT
NEEDLE SPNL 20GX3.5 QUINCKE YW (NEEDLE) ×2 IMPLANT
NS IRRIG 500ML POUR BTL (IV SOLUTION) ×2 IMPLANT
PACK ARTHROSCOPY SHOULDER (MISCELLANEOUS) ×2 IMPLANT
PAD ARMBOARD 7.5X6 YLW CONV (MISCELLANEOUS) ×2 IMPLANT
PAD WRAPON POLAR SHDR UNIV (MISCELLANEOUS) ×1 IMPLANT
PENCIL SMOKE EVACUATOR (MISCELLANEOUS) ×2 IMPLANT
PIN THREADED REVERSE (PIN) ×2 IMPLANT
PULSAVAC PLUS IRRIG FAN TIP (DISPOSABLE) ×2
SCREW BONE LOCKING 4.75X30X3.5 (Screw) ×4 IMPLANT
SCREW BONE STRL 6.5MMX25MM (Screw) ×2 IMPLANT
SCREW LOCKING 4.75MMX15MM (Screw) ×2 IMPLANT
SCREW NON-LOCK 4.75MMX15MM (Screw) ×2 IMPLANT
SHOULDER HUMERAL BEAR 36M STD (Shoulder) ×2 IMPLANT
SLING ULTRA II M (MISCELLANEOUS) IMPLANT
SOL .9 NS 3000ML IRR  AL (IV SOLUTION) ×1
SOL .9 NS 3000ML IRR AL (IV SOLUTION) ×1
SOL .9 NS 3000ML IRR UROMATIC (IV SOLUTION) ×1 IMPLANT
SPONGE LAP 18X18 RF (DISPOSABLE) ×2 IMPLANT
STAPLER SKIN PROX 35W (STAPLE) ×2 IMPLANT
STEM HUMERAL STRL 11MMX55MM (Stem) ×2 IMPLANT
SUT ETHIBOND 0 MO6 C/R (SUTURE) ×2 IMPLANT
SUT FIBERWIRE #2 38 BLUE 1/2 (SUTURE) ×8
SUT VIC AB 0 CT1 36 (SUTURE) ×2 IMPLANT
SUT VIC AB 2-0 CT1 27 (SUTURE) ×4
SUT VIC AB 2-0 CT1 TAPERPNT 27 (SUTURE) ×2 IMPLANT
SUTURE FIBERWR #2 38 BLUE 1/2 (SUTURE) ×4 IMPLANT
SYR 10ML LL (SYRINGE) ×2 IMPLANT
SYR 30ML LL (SYRINGE) IMPLANT
SYR BULB IRRIG 60ML STRL (SYRINGE) ×2 IMPLANT
TIP FAN IRRIG PULSAVAC PLUS (DISPOSABLE) ×1 IMPLANT
TRAY HUM MINI SHOULDER +0 40D (Shoulder) ×2 IMPLANT
WRAPON POLAR PAD SHDR UNIV (MISCELLANEOUS) ×2

## 2020-03-02 NOTE — Op Note (Signed)
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  Post-Op Diagnosis:   Same  Procedure:   Reverse right total shoulder arthroplasty.  Surgeon:   Pascal Lux, MD  Assistant:   Cameron Proud, PA-C  Anesthesia:   General endotracheal with an interscalene block using Exparel placed preoperatively by the anesthesiologist.  Findings:   As above.  Complications:   None  EBL:   100 cc  Fluids:   1100 cc crystalloid  UOP:   None  TT:   None  Drains:   None  Closure:   Staples  Implants:   All press-fit Biomet Comprehensive system with a #11 micro-humeral stem, a 40 mm humeral tray with a standard insert, and a mini-base plate with a 36 mm glenosphere.  Brief Clinical Note:   The patient is a 64 year old female with a long history of progressively worsening right shoulder pain and limited range of motion. Her symptoms have progressed despite medications, activity modification, etc. Her history and examination consistent with advanced degenerative joint disease. An MRI scan demonstrates moderate tendinopathy of the rotator cuff. The patient presents at this time for a reverse right total shoulder arthroplasty.  Procedure:   The patient underwent placement of an interscalene block using Exparel by the anesthesiologist in the preoperative holding area before being brought into the operating room and lain in the supine position. The patient then underwent general endotracheal intubation and anesthesia before the patient was repositioned in the beach chair position using the beach chair positioner. The right shoulder and upper extremity were prepped with ChloraPrep solution before being draped sterilely. Preoperative antibiotics were administered. A standard anterior approach to the shoulder was made through an approximately 4-5 inch incision. The incision was carried down through the  subcutaneous tissues to expose the deltopectoral fascia. The interval between the deltoid and pectoralis muscles was identified and this plane developed, retracting the cephalic vein laterally with the deltoid muscle. The conjoined tendon was identified. Its lateral margin was dissected and the Kolbel self-retraining retractor inserted. The "three sisters" were identified and cauterized. Bursal tissues were removed to improve visualization. The subscapularis tendon was released from its attachment to the lesser tuberosity 1 cm proximal to its insertion and several tagging sutures placed. The inferior capsule was released with care after identifying and protecting the axillary nerve. The proximal humeral cut was made at approximately 25 of retroversion using the extra-medullary guide.   Attention was redirected to the glenoid. The labrum was debrided circumferentially before the center of the glenoid was marked with electrocautery. The guidewire was drilled into the glenoid neck using the appropriate guide. After verifying its position, it was overreamed with the mini-baseplate reamer to create a flat surface. The permanent mini-baseplate was impacted into place. It was stabilized with a 25 x 6.5 mm central screw and four peripheral screws. Locking screws were placed superiorly, inferiorly, and anteriorly while a nonlocking screw was placed posteriorly. The permanent 36 mm glenosphere was then impacted into place and its Morse taper locking mechanism verified using manual distraction.  Attention was directed to the humeral side. The humeral canal was reamed sequentially beginning with the end-cutting reamer then progressing from a 4 mm reamer up to an 11 mm reamer. This provided excellent circumferential chatter. The canal was broached beginning with a #9 broach and progressing to a #11 broach. This was left in place and a trial reduction  performed using the standard trial humeral platform. The arm demonstrated  excellent range of motion as the hand could be brought across the chest to the opposite shoulder and brought to the top of the patient's head and to the patient's ear. The shoulder appeared stable throughout this range of motion. The joint was dislocated and the trial components removed. The permanent #11 micro-stem was impacted into place with care taken to maintain the appropriate version. The permanent 40 mm humeral platform with the standard insert was put together on the back table and impacted into place. Again, the [facility] taper locking mechanism was verified using manual distraction. The shoulder was relocated using two finger pressure and again placed through a range of motion with the findings as described above.  The wound was copiously irrigated with sterile saline solution using the jet lavage system before a total of 20 cc of Exparel diluted out to 60 cc with normal saline and 30 cc of 0.5% Sensorcaine with epinephrine was injected into the pericapsular and peri-incisional tissues to help with postoperative analgesia. The subscapularis tendon was reapproximated using #2 FiberWire interrupted sutures. The deltopectoral interval was closed using #0 Vicryl interrupted sutures before the subcutaneous tissues were closed using 2-0 Vicryl interrupted sutures. The skin was closed using staples. Prior to closing the skin, 1 g of transexemic acid in 10 cc of normal saline was injected intra-articularly to help with postoperative bleeding. A sterile occlusive dressing was applied to the wound before the arm was placed into a shoulder immobilizer with an abduction pillow. A Polar Care system also was applied to the shoulder. The patient was then transferred back to a hospital bed before being awakened, extubated, and returned to the recovery room in satisfactory condition after tolerating the procedure well.

## 2020-03-02 NOTE — Discharge Instructions (Addendum)
AMBULATORY SURGERY  DISCHARGE INSTRUCTIONS   1) The drugs that you were given will stay in your system until tomorrow so for the next 24 hours you should not:  A) Drive an automobile B) Make any legal decisions C) Drink any alcoholic beverage   2) You may resume regular meals tomorrow.  Today it is better to start with liquids and gradually work up to solid foods.  You may eat anything you prefer, but it is better to start with liquids, then soup and crackers, and gradually work up to solid foods.   3) Please notify your doctor immediately if you have any unusual bleeding, trouble breathing, redness and pain at the surgery site, drainage, fever, or pain not relieved by medication.    4) Additional Instructions:        Please contact your physician with any problems or Same Day Surgery at [phone], Monday through Friday 6 am to 4 pm, or [place] at [facility] number at [phone].Orthopedic discharge instructions: May shower with intact op-site dressing. Apply ice frequently to shoulder or use polar care device. Resume Meloxicam daily with meals beginning tomorrow. Take oxycodone as prescribed when needed.  May supplement with ES Tylenol if necessary. Keep shoulder immobilizer on at all times except may remove for bathing purposes. Follow-up in 10-14 days or as scheduled.     Interscalene Nerve Block with Exparel  1.  For your surgery you have received an Interscalene Nerve Block with Exparel. 2. Nerve Blocks affect many types of nerves, including nerves that control movement, pain and normal sensation.  You may experience feelings such as numbness, tingling, heaviness, weakness or the inability to move your arm or the feeling or sensation that your arm has "fallen asleep". 3. A nerve block with Exparel can last up to 5 days.  Usually the weakness wears off first.  The tingling and heaviness usually wear off next.  Finally you may start to notice pain.  Keep in  mind that this may occur in any order.  Once a nerve block starts to wear off it is usually completely gone within 60 minutes. 4. ISNB may cause mild shortness of breath, a hoarse voice, blurry vision, unequal pupils, or drooping of the face on the same side as the nerve block.  These symptoms will usually resolve with the numbness.  Very rarely the procedure itself can cause mild seizures. 5. If needed, your surgeon will give you a prescription for pain medication.  It will take about 60 minutes for the oral pain medication to become fully effective.  So, it is recommended that you start taking this medication before the nerve block first begins to wear off, or when you first begin to feel discomfort. 6. Take your pain medication only as prescribed.  Pain medication can cause sedation and decrease your breathing if you take more than you need for the level of pain that you have. 7. Nausea is a common side effect of many pain medications.  You may want to eat something before taking your pain medicine to prevent nausea. 8. After an Interscalene nerve block, you cannot feel pain, pressure or extremes in temperature in the effected arm.  Because your arm is numb it is at an increased risk for injury.  To decrease the possibility of injury, please practice the following:  a. While you are awake change the position of your arm frequently to prevent too much pressure on any one area for prolonged periods of time. b.  If you have a cast or tight dressing, check the color or your fingers every couple of hours.  Call your surgeon with the appearance of any discoloration (white or blue). c. If you are given a sling to wear before you go home, please wear it  at all times until the block has completely worn off.  Do not get up at night without your sling. d. Please contact [facility] or your surgeon if you do not begin to regain sensation after 7 days from the surgery.  Anesthesia may be contacted by calling  the Same Day Surgery Department, Mon. through Fri., 6 am to 4 pm at [phone].   e. If you experience any other problems or concerns, please contact your surgeon's office. f. If you experience severe or prolonged shortness of breath go to the nearest emergency department.

## 2020-03-02 NOTE — H&P (Signed)
History of Present Illness:  Felicia Lane is a 64 y.o. female that presents to clinic today for her preoperative history and evaluation. Patient presents unaccompanied. The patient is scheduled to undergo a right reverse total shoulder arthroplasty on 03/02/20 by Dr. Roland Rack. The patient reports a long history of right shoulder pain. Pain is worse with movement of the shoulder and described as aching, constant, nagging, stabbing, and throbbing. Pain is located over the lateral and anterior aspect of the shoulder. Aggravating factors include everyday activities and sleeping. Denies numbness, tingling in the right hand.  The patient's symptoms have progressed to the point that they decrease her quality of life. The patient has previously undergone conservative treatment including NSAIDS, activity modification, and cortisone injections without adequate control of her symptoms.  Patient has received all necessary clearances for surgery.   Past Medical History:  . Obesity   Past Surgical History:  No pertinent surgical history.  Current Medications:  . acetaminophen (TYLENOL) 500 MG tablet Take 1,000 mg by mouth every 6 (six) hours as needed  . allopurinoL (ZYLOPRIM) 100 MG tablet Take 100 mg by mouth once daily  . aspirin 81 MG EC tablet Take 81 mg by mouth once daily  . atorvastatin (LIPITOR) 40 MG tablet Take 40 mg by mouth once daily  . cholecalciferol, vitamin D3, (CHOLECALCIFEROL, VIT D3,,BULK,) 100,000 unit/gram Powd Take 1 tablet by mouth once daily  . cyanocobalamin, vitamin B-12, (CYANOCOBALAMIN,VIT B-12,,BULK,) Powd Take 1 tablet by mouth once daily  . DULoxetine (CYMBALTA) 30 MG DR capsule Take 60 mg by mouth once daily  . fexofenadine (ALLEGRA) 180 MG tablet Take by mouth  . FUROsemide (LASIX) 20 MG tablet Take 20 mg by mouth once daily  . gabapentin (NEURONTIN) 600 MG tablet Take 600 mg by mouth 2 (two) times daily  . glipiZIDE (GLUCOTROL) 10 MG tablet 10 mg every morning before  breakfast  . glucosamine-chondroitin 500-400 mg capsule Take 1 capsule by mouth once daily  . hydroCHLOROthiazide (HYDRODIURIL) 25 MG tablet TAKE 1 TABLET BY MOUTH EVERY DAY 90 tablet 3  . lisinopriL (ZESTRIL) 10 MG tablet Take 10 mg by mouth once daily  . meloxicam (MOBIC) 15 MG tablet Take 15 mg by mouth once daily  . metFORMIN (GLUCOPHAGE) 500 MG tablet Take 500 mg by mouth 2 (two) times daily  . multivitamin with minerals tablet Take 1 tablet by mouth once daily  . omega-3 fatty acids-fish oil 360-1,200 mg Cap Take 1 capsule by mouth once daily  . SITagliptin (JANUVIA) 100 MG tablet Take 100 mg by mouth once daily   No current facility-administered medications for this visit.   Allergies:  . Codeine Anaphylaxis   Social History:   Socioeconomic History:  Marland Kitchen Marital status: Married  Spouse name: Not on file  . Number of children: Not on file  . Years of education: Not on file  . Highest education level: Not on file  Occupational History:  . Not on file  Tobacco Use:  . Smoking status: Never Smoker  . Smokeless tobacco: Never Used  Vaping Use:  Marland Kitchen Vaping Use: Never used  Substance and Sexual Activity:  . Alcohol use: Never  . Drug use: Never  . Sexual activity: Not on file  Other Topics Concern:  . Not on file  Social History Narrative:  . Not on file   Social Determinants of Health:   Financial Resource Strain:  . Difficulty of Paying Living Expenses:  Food Insecurity:  . Worried About [employer] of  Food in the Last Year:  . [place] in the Last Year:  Transportation Needs:  . Film/video editor (Medical):  Marland Kitchen Lack of Transportation (Non-Medical):  Physical Activity:  . Days of Exercise per Week:  . Minutes of Exercise per Session:  Stress:  . Feeling of Stress :  Social Connections:  . Frequency of Communication with Friends and Family:  . Frequency of Social Gatherings with Friends and Family:  . Attends Religious Services:  . Active Member of  Clubs or Organizations:  . Attends Archivist Meetings:  Marland Kitchen Marital Status:   Family History: History reviewed. No pertinent family history.  Review of Systems:  A 10+ ROS was performed, reviewed, and the pertinent orthopaedic findings are documented in the HPI.   Physical Examination:  BP 130/72  Ht 167.6 cm (5\' 6" )  Wt (!) 133.1 kg (293 lb 6.4 oz)  BMI 47.36 kg/m   Patient is a well-developed, well-nourished female in no acute distress. Patient has normal mood and affect. Patient is alert and oriented to person, place, and time.   HEENT: Atraumatic, normocephalic. Pupils equal and reactive to light. Extraocular motion intact. Noninjected sclera.  Cardiovascular: Regular rate and rhythm, with no murmurs, rubs, or gallops. Radial pulse 2+  Respiratory: Lungs clear to auscultation bilaterally.   Right shoulder exam: SKIN: normal SWELLING: none WARMTH: none LYMPH NODES: no adenopathy palpable CREPITUS: Mild intermittent glenohumeral crepitance TENDERNESS: Diffusely tender over anterior and lateral aspects of shoulder. ROM (active):  Forward flexion: 105 degrees Abduction: 100 degrees Internal rotation: Right PSIS ROM (passive):  Forward flexion: 125 degrees Abduction: 120 degrees ER/IR at 90 abd: 80 degrees / 65 degrees  She has moderate to severe pain with forward flexion, abduction, and internal rotation.  STRENGTH: Forward flexion: 4/5 Abduction: 4/5 External rotation: 4-4+/5 Internal rotation: 4+/5 Pain with RC testing: Mild pain with resisted forward flexion and abduction more so than with resisted external rotation  STABILITY: Normal  SPECIAL TESTS: Luan Pulling' test: positive, mild Speed's test: negative Capsulitis - pain w/ passive ER: no Crossed arm test: Mildly positive Crank: Not evaluated Anterior apprehension: Negative Posterior apprehension: Not evaluated  She is neurovascularly intact to the right upper extremity.  Sensation is intact  over the median, radial, ulnar, and axillary nerve distributions. Patient able to make an OK sign, thumbs up, and criss-cross the 2nd and 3rd digits.   Tests Performed/Reviewed:  X-rays  No new radiographs were obtained today. Previous radiographs of the shoulder reviewed and revealed loss of glenohumeral joint space with osteophyte formation noted of the humeral head. No fractures or dislocations. Subacromial space is well-preserved.  EXAM: MRI OF THE RIGHT SHOULDER WITHOUT CONTRAST 1. Severe right glenohumeral joint osteoarthritis. 2. Moderate rotator cuff tendinosis with low-grade partial-thickness tears of the supraspinatus and subscapularis tendons. 3. Advanced intra-articular biceps tendinosis. 4. Complete fatty infiltration of the teres minor muscle, which can be seen in the setting of chronic denervation. No focal abnormality identified within the quadrilateral space.  Impression:  Primary osteoarthritis of right shoulder. Impingement/tendinopathy, right shoulder.   Plan:  The patient will benefit from a right reverse total shoulder arthroplasty. Having failed conservative treatment, the patient has elected to proceed with surgery. The risks of surgery, including infection and blood clots, were discussed with the patient. Measures taken to reduce these risks were also discussed with the patient. The postoperative course was discussed with the patient. Patient was instructed to stop all blood thinners prior to surgery. Patient understands and  elects to proceed with surgery. The patient will undergo a right reverse total shoulder arthroplasty by Dr. Roland Rack.   H&P reviewed and patient re-examined. No changes.

## 2020-03-02 NOTE — Anesthesia Postprocedure Evaluation (Signed)
Anesthesia Post Note  Patient: Felicia Lane Im  Procedure(s) Performed: REVERSE SHOULDER ARTHROPLASTY (Right Shoulder)  Patient location during evaluation: PACU Anesthesia Type: General Level of consciousness: awake and alert Pain management: pain level controlled Vital Signs Assessment: post-procedure vital signs reviewed and stable Respiratory status: spontaneous breathing, nonlabored ventilation, respiratory function stable and patient connected to nasal cannula oxygen Cardiovascular status: blood pressure returned to baseline and stable Postop Assessment: no apparent nausea or vomiting Anesthetic complications: no   No complications documented.   Last Vitals:  Vitals:   03/02/20 1111 03/02/20 1124  BP: 132/69 (!) 149/71  Pulse: 99 (!) 102  Resp: 17 20  Temp: 36.7 C (!) 36.3 C  SpO2: 95% 94%    Last Pain:  Vitals:   03/02/20 1124  TempSrc: Temporal  PainSc: 0-No pain                 Martha Clan

## 2020-03-02 NOTE — Anesthesia Procedure Notes (Signed)
Procedure Name: Intubation Date/Time: 03/02/2020 7:47 AM Performed by: Jerrye Noble, CRNA Pre-anesthesia Checklist: Patient identified, Emergency Drugs available, Suction available and Patient being monitored Patient Re-evaluated:Patient Re-evaluated prior to induction Oxygen Delivery Method: Circle system utilized Preoxygenation: Pre-oxygenation with 100% oxygen Induction Type: IV induction Ventilation: Mask ventilation without difficulty Laryngoscope Size: McGraph and 3 Grade View: Grade I Tube type: Oral Tube size: 7.0 mm Number of attempts: 1 Airway Equipment and Method: Stylet and Oral airway Placement Confirmation: ETT inserted through vocal cords under direct vision,  positive ETCO2 and breath sounds checked- equal and bilateral Secured at: 22 cm Tube secured with: Tape Dental Injury: Teeth and Oropharynx as per pre-operative assessment

## 2020-03-02 NOTE — Anesthesia Preprocedure Evaluation (Signed)
Anesthesia Evaluation  Patient identified by MRN, date of birth, ID band Patient awake    Reviewed: Allergy & Precautions, H&P , NPO status , Patient's Chart, lab work & pertinent test results, reviewed documented beta blocker date and time   History of Anesthesia Complications (+) PONV, Family history of anesthesia reaction and history of anesthetic complications  Airway Mallampati: II  TM Distance: >3 FB Neck ROM: full    Dental  (+) Teeth Intact, Poor Dentition   Pulmonary neg pulmonary ROS, neg shortness of breath, neg sleep apnea,    Pulmonary exam normal        Cardiovascular Exercise Tolerance: Good hypertension, On Medications negative cardio ROS Normal cardiovascular exam Rhythm:regular Rate:Normal     Neuro/Psych Anxiety negative neurological ROS  negative psych ROS   GI/Hepatic negative GI ROS, Neg liver ROS,   Endo/Other  negative endocrine ROSdiabetes, Well Controlled, Type 2, Oral Hypoglycemic Agents  Renal/GU negative Renal ROS  negative genitourinary   Musculoskeletal   Abdominal   Peds  Hematology  (+) Blood dyscrasia, anemia ,   Anesthesia Other Findings Past Medical History: No date: Anemia     Comment:  H/O No date: Anxiety No date: Arthritis No date: Cancer ([place])     Comment:  SKIN CANCER-MELANOMA No date: Complication of anesthesia No date: Diabetes mellitus without complication (HCC) No date: Family history of adverse reaction to anesthesia     Comment:  MOM-BP DROPPED VERY LOW No date: Hypertension No date: PONV (postoperative nausea and vomiting) Past Surgical History: No date: ABDOMINAL HYSTERECTOMY No date: CHOLECYSTECTOMY No date: TUBAL LIGATION BMI    Body Mass Index: 48.02 kg/m     Reproductive/Obstetrics negative OB ROS                             Anesthesia Physical Anesthesia Plan  ASA: III  Anesthesia Plan: General ETT   Post-op Pain  Management:  Regional for Post-op pain   Induction:   PONV Risk Score and Plan: 4 or greater  Airway Management Planned:   Additional Equipment:   Intra-op Plan:   Post-operative Plan:   Informed Consent: I have reviewed the patients History and Physical, chart, labs and discussed the procedure including the risks, benefits and alternatives for the proposed anesthesia with the patient or authorized representative who has indicated his/her understanding and acceptance.     Dental Advisory Given  Plan Discussed with: CRNA  Anesthesia Plan Comments:         Anesthesia Quick Evaluation

## 2020-03-02 NOTE — Anesthesia Procedure Notes (Signed)
Anesthesia Regional Block: Interscalene brachial plexus block   Pre-Anesthetic Checklist: ,, timeout performed, Correct Patient, Correct Site, Correct Laterality, Correct Procedure, Correct Position, site marked, Risks and benefits discussed,  Surgical consent,  Pre-op evaluation,  At surgeon's request and post-op pain management  Laterality: Right and Upper  Prep: chloraprep       Needles:  Injection technique: Single-shot  Needle Type: Stimiplex     Needle Length: 5cm  Needle Gauge: 22     Additional Needles:   Procedures:,,,, ultrasound used (permanent image in chart),,,,  Narrative:  Start time: 03/02/2020 7:29 AM End time: 03/02/2020 7:32 AM Injection made incrementally with aspirations every 5 mL.  Performed by: Personally  Anesthesiologist: Martha Clan, MD  Additional Notes: Functioning IV was confirmed and monitors were applied.  A 32mm 22ga Stimuplex needle was used. Sterile prep and drape,hand hygiene and sterile gloves were used.  Negative aspiration and negative test dose prior to incremental administration of local anesthetic. The patient tolerated the procedure well.

## 2020-03-02 NOTE — Transfer of Care (Signed)
Immediate Anesthesia Transfer of Care Note  Patient: Felicia Lane  Procedure(s) Performed: REVERSE SHOULDER ARTHROPLASTY (Right Shoulder)  Patient Location: PACU  Anesthesia Type:GA combined with regional for post-op pain  Level of Consciousness: awake, alert , oriented and patient cooperative  Airway & Oxygen Therapy: Patient Spontanous Breathing and Patient connected to face mask oxygen  Post-op Assessment: Report given to RN and Post -op Vital signs reviewed and stable  Post vital signs: Reviewed and stable  Last Vitals:  Vitals Value Taken Time  BP 137/55 03/02/20 1012  Temp 36.7 C 03/02/20 1012  Pulse 100 03/02/20 1019  Resp 17 03/02/20 1019  SpO2 96 % 03/02/20 1019  Vitals shown include unvalidated device data.  Last Pain:  Vitals:   03/02/20 1012  TempSrc:   PainSc: (P) 7          Complications: No complications documented.

## 2020-03-03 DIAGNOSIS — Z96611 Presence of right artificial shoulder joint: Secondary | ICD-10-CM | POA: Insufficient documentation

## 2020-03-03 LAB — SURGICAL PATHOLOGY

## 2020-07-25 ENCOUNTER — Encounter: Payer: Self-pay | Admitting: Podiatry

## 2020-07-25 ENCOUNTER — Ambulatory Visit (INDEPENDENT_AMBULATORY_CARE_PROVIDER_SITE_OTHER): Payer: Medicare HMO

## 2020-07-25 ENCOUNTER — Ambulatory Visit: Payer: Medicare HMO | Admitting: Podiatry

## 2020-07-25 ENCOUNTER — Other Ambulatory Visit: Payer: Self-pay

## 2020-07-25 DIAGNOSIS — M7662 Achilles tendinitis, left leg: Secondary | ICD-10-CM

## 2020-07-25 MED ORDER — BETAMETHASONE SOD PHOS & ACET 6 (3-3) MG/ML IJ SUSP
3.0000 mg | Freq: Once | INTRAMUSCULAR | Status: AC
  Administered 2020-07-25: 3 mg via INTRA_ARTICULAR

## 2020-07-25 MED ORDER — METHYLPREDNISOLONE 4 MG PO TBPK: ORAL_TABLET | ORAL | 0 refills | Status: DC

## 2020-07-25 NOTE — Progress Notes (Signed)
   HPI: 65 y.o. female presenting today pain and tenderness to the back of the left heel has been going on for approximately 1 month now.  Patient states it gets to the point where she is unable to stand on her foot.  She denies a history of injury.  She currently has not done anything for treatment.  She presents for further treatment and evaluation    Past Medical History:  Diagnosis Date  . Anemia    H/O  . Anxiety   . Arthritis   . Cancer ([place])    SKIN CANCER-MELANOMA  . Complication of anesthesia   . Diabetes mellitus without complication ([place])   . Family history of adverse reaction to anesthesia    MOM-BP DROPPED VERY LOW  . Hypertension   . PONV (postoperative nausea and vomiting)       Physical Exam: General: The patient is alert and oriented x3 in no acute distress.  Dermatology: Skin is warm, dry and supple bilateral lower extremities. Negative for open lesions or macerations.  Vascular: Palpable pedal pulses bilaterally. No edema or erythema noted. Capillary refill within normal limits.  Neurological: Epicritic and protective threshold grossly intact bilaterally.   Musculoskeletal Exam: Pain on palpation noted to the posterior tubercle of the left calcaneus at the insertion of the Achilles tendon consistent with retrocalcaneal bursitis. Range of motion within normal limits. Muscle strength 5/5 in all muscle groups bilateral lower extremities.  Radiographic Exam:  Posterior calcaneal spur noted to the respective calcaneus on lateral view with small intermittent calcifications within the body of the Achilles tendon. No fracture or dislocation noted. Normal osseous mineralization noted.     Assessment: 1. Insertional Achilles tendinitis left 2. Retrocalcaneal bursitis   Plan of Care:  1. Patient was evaluated. Radiographs were reviewed today. 2. Injection of 0.5 mL Celestone Soluspan injected into the retrocalcaneal bursa. Care was taken to avoid direct injection  into the Achilles tendon. 3.  Prescription for Medrol Dosepak, then resume meloxicam 15 mg daily the patient already has prescribed 4.  Today were going to immobilize the patient in a cam boot x3 weeks.  Weightbearing as tolerated.  Cam boot dispensed. 5.  Return to clinic in 3 weeks  *Husband recently passed away in 06/09/20   Edrick Kins, DPM [facility]  Dr. Edrick Kins, DPM    [address], [place] [address]                Office [phone]  Fax [phone]

## 2020-08-15 ENCOUNTER — Ambulatory Visit: Payer: Medicare HMO | Admitting: Podiatry

## 2020-08-18 ENCOUNTER — Encounter: Payer: Self-pay | Admitting: Podiatry

## 2020-08-18 ENCOUNTER — Ambulatory Visit: Payer: Medicare HMO | Admitting: Podiatry

## 2020-08-18 ENCOUNTER — Other Ambulatory Visit: Payer: Self-pay

## 2020-08-18 DIAGNOSIS — M7662 Achilles tendinitis, left leg: Secondary | ICD-10-CM

## 2020-08-18 MED ORDER — BETAMETHASONE SOD PHOS & ACET 6 (3-3) MG/ML IJ SUSP
3.0000 mg | Freq: Once | INTRAMUSCULAR | Status: AC
  Administered 2020-08-18: 3 mg via INTRA_ARTICULAR

## 2020-08-18 NOTE — Progress Notes (Signed)
   HPI: 65 y.o. female presenting today for follow-up evaluation of Achilles tendinitis to the left lower extremity.  She states that she does have some improvement however she is unable to walk without the cam boot.  She completed the Medrol Dosepak and currently taking the meloxicam 15 mg daily.  She presents for further treatment evaluation   Past Medical History:  Diagnosis Date  . Anemia    H/O  . Anxiety   . Arthritis   . Cancer ([place])    SKIN CANCER-MELANOMA  . Complication of anesthesia   . Diabetes mellitus without complication ([place])   . Family history of adverse reaction to anesthesia    MOM-BP DROPPED VERY LOW  . Hypertension   . PONV (postoperative nausea and vomiting)       Physical Exam: General: The patient is alert and oriented x3 in no acute distress.  Dermatology: Skin is warm, dry and supple bilateral lower extremities. Negative for open lesions or macerations.  Vascular: Palpable pedal pulses bilaterally. No edema or erythema noted. Capillary refill within normal limits.  Neurological: Epicritic and protective threshold grossly intact bilaterally.   Musculoskeletal Exam: Pain on palpation noted to the posterior tubercle of the left calcaneus at the insertion of the Achilles tendon consistent with retrocalcaneal bursitis. Range of motion within normal limits. Muscle strength 5/5 in all muscle groups bilateral lower extremities.   Assessment: 1. Insertional Achilles tendinitis left 2. Retrocalcaneal bursitis   Plan of Care:  1. Patient was evaluated.  2. Injection of 0.5 mL Celestone Soluspan injected into the retrocalcaneal bursa lateral aspect. Care was taken to avoid direct injection into the Achilles tendon. 3.  Continue meloxicam 15 mg daily as prescribed 4.  Patient may begin to transition out of the cam boot 5.  Daily stretching exercises were recommended and demonstrated today.  She states that she is going to begin pool exercises at the [facility] 6.   Return to clinic in 4 weeks  *Husband recently passed away in Jun 01, 2020   Edrick Kins, DPM [facility]  Dr. Edrick Kins, DPM    [address], [place] [address]                Office [phone]  Fax [phone]

## 2020-09-19 ENCOUNTER — Other Ambulatory Visit: Payer: Self-pay

## 2020-09-19 ENCOUNTER — Ambulatory Visit (INDEPENDENT_AMBULATORY_CARE_PROVIDER_SITE_OTHER): Payer: Medicare HMO | Admitting: Podiatry

## 2020-09-19 DIAGNOSIS — M7662 Achilles tendinitis, left leg: Secondary | ICD-10-CM | POA: Diagnosis not present

## 2020-09-19 MED ORDER — BETAMETHASONE SOD PHOS & ACET 6 (3-3) MG/ML IJ SUSP
3.0000 mg | Freq: Once | INTRAMUSCULAR | Status: AC
  Administered 2020-09-19: 3 mg via INTRA_ARTICULAR

## 2020-09-19 NOTE — Progress Notes (Signed)
   HPI: 65 y.o. female presenting today for follow-up evaluation of Achilles tendinitis to the left lower extremity.  Patient states that overall she has had some improvement of the Achilles tendon.  She continues to do exercises and stretching daily.  She also takes prescription meloxicam daily.  No new complaints at this time  Past Medical History:  Diagnosis Date  . Anemia    H/O  . Anxiety   . Arthritis   . Cancer ([place])    SKIN CANCER-MELANOMA  . Complication of anesthesia   . Diabetes mellitus without complication ([place])   . Family history of adverse reaction to anesthesia    MOM-BP DROPPED VERY LOW  . Hypertension   . PONV (postoperative nausea and vomiting)       Physical Exam: General: The patient is alert and oriented x3 in no acute distress.  Dermatology: Skin is warm, dry and supple bilateral lower extremities. Negative for open lesions or macerations.  Vascular: Palpable pedal pulses bilaterally. No edema or erythema noted. Capillary refill within normal limits.  Neurological: Epicritic and protective threshold grossly intact bilaterally.   Musculoskeletal Exam: Pain on palpation noted to the posterior tubercle of the left calcaneus at the insertion of the Achilles tendon consistent with retrocalcaneal bursitis. Range of motion within normal limits. Muscle strength 5/5 in all muscle groups bilateral lower extremities.   Assessment: 1. Insertional Achilles tendinitis left 2. Retrocalcaneal bursitis   Plan of Care:  1. Patient was evaluated.  2. Injection of 0.5 mL Celestone Soluspan injected into the retrocalcaneal bursa lateral aspect. Care was taken to avoid direct injection into the Achilles tendon. 3.  Continue meloxicam 15 mg daily as prescribed 4.  Patient has been using the cam boot intermittently as needed. 5.  Daily stretching exercises were recommended and demonstrated today.  She states that she is going to begin pool exercises at the [facility] 6.  Recommend  OTC Voltaren topical gel to the posterior tubercle of the calcaneus and Achilles tendon daily 7.  Return to clinic as needed  *Husband recently passed away in May 27, 2020   Edrick Kins, DPM [facility]  Dr. Edrick Kins, DPM    [address], [place] [address]                Office [phone]  Fax [phone]

## 2020-10-04 ENCOUNTER — Emergency Department: Payer: Medicare HMO

## 2020-10-04 ENCOUNTER — Inpatient Hospital Stay: Payer: Medicare HMO

## 2020-10-04 ENCOUNTER — Other Ambulatory Visit: Payer: Self-pay

## 2020-10-04 ENCOUNTER — Observation Stay
Admission: EM | Admit: 2020-10-04 | Discharge: 2020-10-06 | Disposition: A | Discharge status: Home / Self Care | Payer: Medicare HMO | Attending: Student in an Organized Health Care Education/Training Program | Admitting: Student in an Organized Health Care Education/Training Program

## 2020-10-04 DIAGNOSIS — D72829 Elevated white blood cell count, unspecified: Secondary | ICD-10-CM | POA: Insufficient documentation

## 2020-10-04 DIAGNOSIS — Z20822 Contact with and (suspected) exposure to covid-19: Secondary | ICD-10-CM | POA: Insufficient documentation

## 2020-10-04 DIAGNOSIS — Z7984 Long term (current) use of oral hypoglycemic drugs: Secondary | ICD-10-CM | POA: Diagnosis not present

## 2020-10-04 DIAGNOSIS — N1832 Chronic kidney disease, stage 3b: Secondary | ICD-10-CM | POA: Insufficient documentation

## 2020-10-04 DIAGNOSIS — E1122 Type 2 diabetes mellitus with diabetic chronic kidney disease: Secondary | ICD-10-CM | POA: Insufficient documentation

## 2020-10-04 DIAGNOSIS — T148XXA Other injury of unspecified body region, initial encounter: Secondary | ICD-10-CM

## 2020-10-04 DIAGNOSIS — N179 Acute kidney failure, unspecified: Secondary | ICD-10-CM | POA: Insufficient documentation

## 2020-10-04 DIAGNOSIS — I129 Hypertensive chronic kidney disease with stage 1 through stage 4 chronic kidney disease, or unspecified chronic kidney disease: Secondary | ICD-10-CM | POA: Diagnosis not present

## 2020-10-04 DIAGNOSIS — J9602 Acute respiratory failure with hypercapnia: Secondary | ICD-10-CM | POA: Diagnosis not present

## 2020-10-04 DIAGNOSIS — Z85820 Personal history of malignant melanoma of skin: Secondary | ICD-10-CM | POA: Diagnosis not present

## 2020-10-04 DIAGNOSIS — M6281 Muscle weakness (generalized): Secondary | ICD-10-CM | POA: Diagnosis not present

## 2020-10-04 DIAGNOSIS — R55 Syncope and collapse: Secondary | ICD-10-CM | POA: Diagnosis not present

## 2020-10-04 DIAGNOSIS — Z79899 Other long term (current) drug therapy: Secondary | ICD-10-CM | POA: Diagnosis not present

## 2020-10-04 DIAGNOSIS — I10 Essential (primary) hypertension: Secondary | ICD-10-CM | POA: Diagnosis not present

## 2020-10-04 DIAGNOSIS — R262 Difficulty in walking, not elsewhere classified: Secondary | ICD-10-CM | POA: Insufficient documentation

## 2020-10-04 DIAGNOSIS — J9601 Acute respiratory failure with hypoxia: Secondary | ICD-10-CM | POA: Insufficient documentation

## 2020-10-04 DIAGNOSIS — Z9189 Other specified personal risk factors, not elsewhere classified: Secondary | ICD-10-CM | POA: Diagnosis not present

## 2020-10-04 DIAGNOSIS — E119 Type 2 diabetes mellitus without complications: Secondary | ICD-10-CM

## 2020-10-04 DIAGNOSIS — Z96611 Presence of right artificial shoulder joint: Secondary | ICD-10-CM | POA: Diagnosis not present

## 2020-10-04 DIAGNOSIS — E66813 Obesity, class 3: Secondary | ICD-10-CM | POA: Diagnosis present

## 2020-10-04 LAB — COMPREHENSIVE METABOLIC PANEL
ALT: 17 U/L (ref 0–44)
AST: 29 U/L (ref 15–41)
Albumin: 3.9 g/dL (ref 3.5–5.0)
Alkaline Phosphatase: 147 U/L — ABNORMAL HIGH (ref 38–126)
Anion gap: 11 (ref 5–15)
BUN: 43 mg/dL — ABNORMAL HIGH (ref 8–23)
CO2: 27 mmol/L (ref 22–32)
Calcium: 9.5 mg/dL (ref 8.9–10.3)
Chloride: 98 mmol/L (ref 98–111)
Creatinine, Ser: 1.81 mg/dL — ABNORMAL HIGH (ref 0.44–1.00)
GFR, Estimated: 30 mL/min — ABNORMAL LOW (ref >60)
Glucose, Bld: 156 mg/dL — ABNORMAL HIGH (ref 70–99)
Potassium: 5 mmol/L (ref 3.5–5.1)
Sodium: 136 mmol/L (ref 135–145)
Total Bilirubin: 0.7 mg/dL (ref 0.3–1.2)
Total Protein: 7.9 g/dL (ref 6.5–8.1)

## 2020-10-04 LAB — CBC
HCT: 36.3 % (ref 36.0–46.0)
Hemoglobin: 11.3 g/dL — ABNORMAL LOW (ref 12.0–15.0)
MCH: 27.5 pg (ref 26.0–34.0)
MCHC: 31.1 g/dL (ref 30.0–36.0)
MCV: 88.3 fL (ref 80.0–100.0)
Platelets: 287 10*3/uL (ref 150–400)
RBC: 4.11 MIL/uL (ref 3.87–5.11)
RDW: 16.4 % — ABNORMAL HIGH (ref 11.5–15.5)
WBC: 10.9 10*3/uL — ABNORMAL HIGH (ref 4.0–10.5)
nRBC: 0 % (ref 0.0–0.2)

## 2020-10-04 LAB — TROPONIN I (HIGH SENSITIVITY)
Troponin I (High Sensitivity): 3 ng/L (ref <18)
Troponin I (High Sensitivity): 3 ng/L (ref <18)
Troponin I (High Sensitivity): 5 ng/L (ref <18)

## 2020-10-04 LAB — DIFFERENTIAL
Abs Immature Granulocytes: 0.05 10*3/uL (ref 0.00–0.07)
Basophils Absolute: 0.1 10*3/uL (ref 0.0–0.1)
Basophils Relative: 1 %
Eosinophils Absolute: 0.1 10*3/uL (ref 0.0–0.5)
Eosinophils Relative: 1 %
Immature Granulocytes: 1 %
Lymphocytes Relative: 14 %
Lymphs Abs: 1.6 10*3/uL (ref 0.7–4.0)
Monocytes Absolute: 0.5 10*3/uL (ref 0.1–1.0)
Monocytes Relative: 5 %
Neutro Abs: 8.6 10*3/uL — ABNORMAL HIGH (ref 1.7–7.7)
Neutrophils Relative %: 78 %

## 2020-10-04 LAB — RESP PANEL BY RT-PCR (FLU A&B, COVID) ARPGX2
Influenza A by PCR: NEGATIVE
Influenza B by PCR: NEGATIVE
SARS Coronavirus 2 by RT PCR: NEGATIVE

## 2020-10-04 LAB — PROTIME-INR
INR: 1 (ref 0.8–1.2)
Prothrombin Time: 13.2 seconds (ref 11.4–15.2)

## 2020-10-04 LAB — CK: Total CK: 94 U/L (ref 38–234)

## 2020-10-04 LAB — GLUCOSE, CAPILLARY: Glucose-Capillary: 70 mg/dL (ref 70–99)

## 2020-10-04 LAB — D-DIMER, QUANTITATIVE: D-Dimer, Quant: 2.08 ug/mL-FEU — ABNORMAL HIGH (ref 0.00–0.50)

## 2020-10-04 LAB — BRAIN NATRIURETIC PEPTIDE: B Natriuretic Peptide: 39.8 pg/mL (ref 0.0–100.0)

## 2020-10-04 LAB — TSH: TSH: 1.28 u[IU]/mL (ref 0.350–4.500)

## 2020-10-04 LAB — APTT: aPTT: 31 seconds (ref 24–36)

## 2020-10-04 LAB — AMMONIA: Ammonia: 17 umol/L (ref 9–35)

## 2020-10-04 MED ORDER — HYDROCHLOROTHIAZIDE 25 MG PO TABS
25.0000 mg | ORAL_TABLET | Freq: Every day | ORAL | Status: DC
  Administered 2020-10-04: 25 mg via ORAL
  Filled 2020-10-04: qty 1

## 2020-10-04 MED ORDER — INSULIN ASPART 100 UNIT/ML IJ SOLN
0.0000 [IU] | Freq: Every day | INTRAMUSCULAR | Status: DC
  Filled 2020-10-04 (×2): qty 1

## 2020-10-04 MED ORDER — ORAL CARE MOUTH RINSE
15.0000 mL | Freq: Two times a day (BID) | OROMUCOSAL | Status: DC
  Administered 2020-10-04 – 2020-10-06 (×4): 15 mL via OROMUCOSAL

## 2020-10-04 MED ORDER — SODIUM CHLORIDE 0.9 % IV BOLUS
500.0000 mL | Freq: Once | INTRAVENOUS | Status: AC
  Administered 2020-10-04: 500 mL via INTRAVENOUS

## 2020-10-04 MED ORDER — LINAGLIPTIN 5 MG PO TABS
5.0000 mg | ORAL_TABLET | Freq: Every day | ORAL | Status: DC
  Administered 2020-10-05 – 2020-10-06 (×2): 5 mg via ORAL
  Filled 2020-10-04 (×3): qty 1

## 2020-10-04 MED ORDER — SODIUM CHLORIDE 0.9% FLUSH
3.0000 mL | Freq: Two times a day (BID) | INTRAVENOUS | Status: DC
  Administered 2020-10-04 – 2020-10-06 (×4): 3 mL via INTRAVENOUS

## 2020-10-04 MED ORDER — HEPARIN SODIUM (PORCINE) 5000 UNIT/ML IJ SOLN
5000.0000 [IU] | Freq: Three times a day (TID) | INTRAMUSCULAR | Status: DC
  Administered 2020-10-04 – 2020-10-06 (×4): 5000 [IU] via SUBCUTANEOUS
  Filled 2020-10-04 (×5): qty 1

## 2020-10-04 MED ORDER — SODIUM CHLORIDE 0.9% FLUSH
3.0000 mL | Freq: Once | INTRAVENOUS | Status: AC
  Administered 2020-10-04: 3 mL via INTRAVENOUS

## 2020-10-04 MED ORDER — ACETAMINOPHEN 325 MG PO TABS: 650.0000 mg | ORAL_TABLET | Freq: Four times a day (QID) | ORAL | Status: DC | PRN

## 2020-10-04 MED ORDER — INSULIN ASPART 100 UNIT/ML IJ SOLN
0.0000 [IU] | Freq: Three times a day (TID) | INTRAMUSCULAR | Status: DC
  Administered 2020-10-05: 2 [IU] via SUBCUTANEOUS
  Administered 2020-10-06: 1 [IU] via SUBCUTANEOUS
  Administered 2020-10-06: 3 [IU] via SUBCUTANEOUS
  Filled 2020-10-04 (×3): qty 1

## 2020-10-04 MED ORDER — LISINOPRIL 5 MG PO TABS
10.0000 mg | ORAL_TABLET | Freq: Every day | ORAL | Status: DC
  Administered 2020-10-04: 10 mg via ORAL
  Filled 2020-10-04: qty 2

## 2020-10-04 MED ORDER — SODIUM CHLORIDE 0.9 % IV SOLN: INTRAVENOUS | Status: AC

## 2020-10-04 MED ORDER — IPRATROPIUM-ALBUTEROL 0.5-2.5 (3) MG/3ML IN SOLN
3.0000 mL | Freq: Once | RESPIRATORY_TRACT | Status: AC
  Administered 2020-10-04: 3 mL via RESPIRATORY_TRACT
  Filled 2020-10-04: qty 3

## 2020-10-04 MED ORDER — METFORMIN HCL ER 500 MG PO TB24
500.0000 mg | ORAL_TABLET | Freq: Two times a day (BID) | ORAL | Status: DC
  Administered 2020-10-05: 500 mg via ORAL
  Filled 2020-10-04: qty 1

## 2020-10-04 MED ORDER — ALLOPURINOL 100 MG PO TABS
100.0000 mg | ORAL_TABLET | Freq: Every day | ORAL | Status: DC
  Administered 2020-10-04 – 2020-10-05 (×2): 100 mg via ORAL
  Filled 2020-10-04 (×4): qty 1

## 2020-10-04 MED ORDER — CHLORHEXIDINE GLUCONATE CLOTH 2 % EX PADS
6.0000 | MEDICATED_PAD | Freq: Every day | CUTANEOUS | Status: DC
  Administered 2020-10-05 – 2020-10-06 (×2): 6 via TOPICAL

## 2020-10-04 MED ORDER — ACETAMINOPHEN 650 MG RE SUPP: 650.0000 mg | Freq: Four times a day (QID) | RECTAL | Status: DC | PRN

## 2020-10-04 MED ORDER — ONDANSETRON HCL 4 MG/2ML IJ SOLN
4.0000 mg | Freq: Once | INTRAMUSCULAR | Status: AC
  Administered 2020-10-04: 4 mg via INTRAVENOUS
  Filled 2020-10-04: qty 2

## 2020-10-04 MED ORDER — ONDANSETRON HCL 4 MG PO TABS: 4.0000 mg | ORAL_TABLET | Freq: Four times a day (QID) | ORAL | Status: DC | PRN

## 2020-10-04 MED ORDER — DULOXETINE HCL 30 MG PO CPEP
60.0000 mg | ORAL_CAPSULE | Freq: Every day | ORAL | Status: DC
  Administered 2020-10-04 – 2020-10-05 (×2): 60 mg via ORAL
  Filled 2020-10-04 (×2): qty 2

## 2020-10-04 MED ORDER — VITAMIN B-12 1000 MCG PO TABS
1000.0000 ug | ORAL_TABLET | Freq: Every day | ORAL | Status: DC
  Administered 2020-10-05 – 2020-10-06 (×2): 1000 ug via ORAL
  Filled 2020-10-04 (×2): qty 1

## 2020-10-04 MED ORDER — ONDANSETRON HCL 4 MG/2ML IJ SOLN: 4.0000 mg | Freq: Four times a day (QID) | INTRAMUSCULAR | Status: DC | PRN

## 2020-10-04 MED ORDER — ATORVASTATIN CALCIUM 20 MG PO TABS
40.0000 mg | ORAL_TABLET | Freq: Every evening | ORAL | Status: DC
  Administered 2020-10-04 – 2020-10-05 (×2): 40 mg via ORAL
  Filled 2020-10-04 (×2): qty 2

## 2020-10-04 NOTE — ED Notes (Signed)
Patient presents with daughter. Daughter reports the patient has been having upper neck pain x 1 week, and was seen at [facility] Monday. Daughter reports she was given a muscle relaxer and pain reliever without relief. The daughter reports the patient was found in her bathroom of her home on the floor, after laying there for an unknown amount of time. The daughter reports the patient has been "disoriented" today, but is unsure of the last know normal. Patient reports "midnight", but daughter reports the patient is unreliable. Patient is alert, oriented to person place and time at this time. Patient sitting in wheelchair at bedside awaiting transport to CT.

## 2020-10-04 NOTE — ED Notes (Signed)
Patient transported to CT 

## 2020-10-04 NOTE — ED Notes (Signed)
Patient transported to X-ray 

## 2020-10-04 NOTE — ED Notes (Signed)
Pt transported to MRI 

## 2020-10-04 NOTE — ED Notes (Signed)
This RN unable to obtain second troponin, lab made aware and advising will send lab tech.

## 2020-10-04 NOTE — ED Triage Notes (Addendum)
Pt comes into the ED via EMS from home, states her daughter found her in the floor, pt is unable to recall if she passed out an hour ago or 12hrs ago. States she has been feeling weak and disoriented since yesterday. Pt c/o lower back pain and neck pain. States she is starting to remember falling going to the BR this morning, states she fell hitting the side of the tube, hitting her head. States she remembers her grandson getting up to go to the gym this morning Pt is disoriented to time, and place  108/64 [place]ance 98%RA [id number]

## 2020-10-04 NOTE — ED Notes (Signed)
2 attempts for blood draw without success, IV established

## 2020-10-04 NOTE — H&P (Addendum)
History and Physical   Felicia Lane OJJ:[id number] DOB: 1956/04/14 DOA: 10/04/2020  PCP: [name], FNP  Outpatient Specialists: Dr. Roland Rack, orthopedic surgery Patient coming from: home via EMS  I have personally briefly reviewed patient's old medical records in [place].  Chief Concern: syncope  HPI: Felicia Lane is a 65 y.o. female with medical history significant for obesity, depression, hypertension, non-insulin-dependent diabetes mellitus, at risk for obstructive sleep apnea, depression, hyperlipidemia, presents to the emergency department for chief concerns of syncope.  Per patient she syncopized early in the morning when there was minimal light out on her way to the bathroom.  She endorsed hitting her head.  She endorsed loss of consciousness however was not able to tell me how long she was out.  She had another episode of syncope late morning and her grandson was aware.  He tried to go into the restroom to help her however she woke up and states that she can take care of herself and got up on her feet.  She had another episode of syncope around lunchtime.  Another daughter found her laying down in front of her bedroom door next to her bed.  Daughter at bedside who is an Therapist, sports, denies fever.  Daughter does endorse that patient has had poor p.o. intake due to diarrhea and nausea over the weekend.  Daughter states that her mother has been feeling poorly for the last few days.  Patient does endorse 1 episode of vomiting over the weekend.  Daughter and patient does endorse that over the weekend she developed bilateral shoulder pain requiring her to present to urgent care center and was prescribed baclofen and naproxen.  Patient at bedside was able to tell me her name, age, location, and stable daughter at bedside [name].  Patient denied shortness of breath, new headaches, vision changes, dysphagia, chest pain, abdominal pain, dysuria.  She does endorse frequent urination.   She denies blood in her stool.  She endorses diarrhea.  Social history: She lives with her grandson, [name] son.  She denies history of tobacco use, etoh, and recreational drug use.   Vaccinations: Patient fully vaccinated for COVID-19, 3 doses  ROS: Constitutional: no weight change, no fever ENT/Mouth: no sore throat, no rhinorrhea Eyes: no eye pain, no vision changes Cardiovascular: no chest pain,no shortness of breath,  no edema, no palpitations Respiratory: no cough, no sputum, no wheezing Gastrointestinal: + nausea, + vomiting, + diarrhea, no constipation Genitourinary: no urinary incontinence, no dysuria, no hematuria Musculoskeletal: no arthralgias, no myalgias Skin: no skin lesions, no pruritus, Neuro: + weakness, no loss of consciousness, no syncope Psych: no anxiety, no depression, + decrease appetite Heme/Lymph: no bruising, no bleeding  ED Course: Discussed with ED provider, patient requiring hospitalization for multiple syncopal events.  Vitals in the emergency department was remarkable for temperature of 97.7, respiration rate of 18, heart rate 78, blood pressure 143/63, SPO2 of 100% on BiPAP.  VBG was ordered and showed pH of 7.26/73/O2 pending  WBC was remarkable for 10.9, hemoglobin 11.3, platelets 287.  Sodium 136, potassium 5.0, chloride 98, bicarb 27, BUN 43, serum creatinine 1.81, EGFR 30.  PT/INR/PTT was within normal limits.  CK was normal at 94, troponin high-sensitivity was 3x2.  Ammonia was 17.  Assessment/Plan  Principal Problem:   Syncope Active Problems:   Hypertension, essential   Obesity, Class III, BMI 40-49.9 (morbid obesity) (HCC)   Status post reverse total shoulder replacement, right   Type 2 diabetes mellitus without complication,  without long-term current use of insulin (HCC)   At risk for obstructive sleep apnea   Multiple syncopal event-query dehydration in setting of GI loss Gastroenteritis Work-up in progress - C. difficile, GI  panel ordered - MRI, complete echo, D-dimer, BNP, urinalysis, UDS - If D-dimer is positive, and if creatinine function improves would recommend a CTA to assess for pulmonary embolism -If the above work-up is negative, I would recommend a.m. team to discharge patient with Holter monitor to be sent to cardiology  Mild leukocytosis- obtaining the above, especially a UA  Hypertension-resume lisinopril 10 mg nightly, hydrochlorothiazide 25 mg nightly  Hyperlipidemia-atorvastatin 40 mg nightly  CKD 3B versus acute kidney injury- last serum creatinine was also 1.81 however previous labs from 5 years ago patient had normal renal function - Status post normal saline 500 mL bolus per EDP - Normal saline 125 mL/h, 1 day ordered - BMP in the a.m.  Gout-allopurinol 100 mg nightly resumed  Non-insulin-dependent diabetes mellitus-resumed metformin 500 mg twice daily, sitagliptin replace with linagliptin 5 mg daily - Glipizide not resumed - Insulin SSI, renal, with at bedtime coverage  Depression/anxiety-duloxetine 60 mg nightly  COVID panel is pending  Chart reviewed.   DVT prophylaxis: Heparin 5000 units subcutaneous every 8 hours Code Status: Full code Diet: Heart healthy/carb modified Family Communication: Updated daughter [name] at bedside Disposition Plan: Pending clinical course Consults called: None at this time Admission status: Stepdown, inpatient, with telemetry  Past Medical History:  Diagnosis Date  . Anemia    H/O  . Anxiety   . Arthritis   . Cancer ([place])    SKIN CANCER-MELANOMA  . Complication of anesthesia   . Diabetes mellitus without complication ([place])   . Family history of adverse reaction to anesthesia    MOM-BP DROPPED VERY LOW  . Hypertension   . PONV (postoperative nausea and vomiting)    Past Surgical History:  Procedure Laterality Date  . ABDOMINAL HYSTERECTOMY    . CHOLECYSTECTOMY    . REVERSE SHOULDER ARTHROPLASTY Right 03/02/2020   Procedure: REVERSE  SHOULDER ARTHROPLASTY;  Surgeon: Corky Mull, MD;  Location: [facility] ORS;  Service: Orthopedics;  Laterality: Right;  . TUBAL LIGATION     Social History:  reports that she has never smoked. She has never used smokeless tobacco. She reports previous alcohol use. She reports that she does not use drugs.  Allergies  Allergen Reactions  . Codeine Itching    Impairs renal function (itching in throat)  . Shellfish Allergy Nausea Only and Other (See Comments)    Sick on stomach   No family history on file. Family history: Family history reviewed and not pertinent  Prior to Admission medications   Medication Sig Start Date End Date Taking? Authorizing Provider  acetaminophen (TYLENOL) 500 MG tablet Take 500-1,000 mg by mouth every 6 (six) hours as needed (for pain.).    [provider]  allopurinol (ZYLOPRIM) 100 MG tablet Take 100 mg by mouth at bedtime. 02/11/20   [provider]  atorvastatin (LIPITOR) 40 MG tablet Take 40 mg by mouth every evening. 12/29/19   [provider]  Cholecalciferol (VITAMIN D3 PO) Take 1 tablet by mouth daily.    [provider]  Cyanocobalamin (VITAMIN B-12 PO) Take 1 tablet by mouth daily.    [provider]  dicyclomine (BENTYL) 20 MG tablet dicyclomine 20 mg tablet  TAKE 1 TABLET BY MOUTH 4 TIMES A DAY FOR 7 DAYS    [provider]  DULoxetine (  CYMBALTA) 30 MG capsule Take 60 mg by mouth at bedtime. 01/12/20   [provider]  erythromycin ophthalmic ointment erythromycin 5 mg/gram (0.5 %) eye ointment    [provider]  furosemide (LASIX) 20 MG tablet Take 20 mg by mouth daily. In the morning. 01/12/20   [provider]  gabapentin (NEURONTIN) 600 MG tablet Take 600 mg by mouth every 8 (eight) hours. 02/15/20   [provider]  glipiZIDE (GLUCOTROL) 10 MG tablet Take 10 mg by mouth 2 (two) times daily. 01/27/20   [provider]  Glucosamine-Chondroitin (COSAMIN DS  PO) Take 1 tablet by mouth at bedtime.    [provider]  hydrochlorothiazide (HYDRODIURIL) 25 MG tablet Take 25 mg by mouth at bedtime. 01/20/20   [provider]  lisinopril (ZESTRIL) 10 MG tablet Take 10 mg by mouth at bedtime. 10/14/19   [provider]  meloxicam (MOBIC) 15 MG tablet Take 15 mg by mouth every evening. 02/10/20   [provider]  metFORMIN (GLUCOPHAGE-XR) 500 MG 24 hr tablet Take 500 mg by mouth 2 (two) times daily. 01/21/20   [provider]  methylPREDNISolone (MEDROL DOSEPAK) 4 MG TBPK tablet 6 day dose pack - take as directed 07/25/20   Edrick Kins, DPM  Multiple Vitamin (MULTIVITAMIN WITH MINERALS) TABS tablet Take 1 tablet by mouth at bedtime.    [provider]  Omega-3 Fatty Acids (FISH OIL) 1200 MG CAPS Take 1,200 mg by mouth at bedtime.    [provider]  sitaGLIPtin (JANUVIA) 100 MG tablet Take 100 mg by mouth at bedtime.    [provider]   Physical Exam: Vitals:   10/04/20 1815 10/04/20 1830 10/04/20 1845 10/04/20 1900  BP:  (!) 113/52  (!) 151/63  Pulse: 80 80 80 81  Resp: $Remo'17 10 20 18  'SCRPL$ Temp:      TempSrc:      SpO2: 99% 100% 100% 100%  Weight:      Height:       Constitutional: appears older than chronological age, NAD, calm, comfortable Eyes: PERRL, lids and conjunctivae normal ENMT: Mucous membranes are dry. Posterior pharynx clear of any exudate or lesions. Age-appropriate dentition. Hearing appropriate Neck: normal, supple, no masses, no thyromegaly Respiratory: clear to auscultation bilaterally, no wheezing, no crackles. Normal respiratory effort. No accessory muscle use.  Cardiovascular: Regular rate and rhythm, no murmurs / rubs / gallops. No extremity edema. 2+ pedal pulses. No carotid bruits.  Abdomen: Morbidly obese abdomen, no tenderness, no masses palpated, no hepatosplenomegaly. Bowel sounds positive.  Musculoskeletal: no clubbing / cyanosis. No joint deformity upper  and lower extremities. Good ROM, no contractures, no atrophy. Normal muscle tone.  Skin: no rashes, lesions, ulcers. No induration Neurologic: Sensation intact. Strength 5/5 in all 4.  Psychiatric: Normal judgment and insight. Alert and oriented x 3. Normal mood.   EKG: independently reviewed, showing sinus rhythm with rate of 77, QTc 409  Chest x-ray on Admission: I personally reviewed and I agree with radiologist reading as below.  DG Chest 2 View  Result Date: 10/04/2020 CLINICAL DATA:  Fall with left-sided shoulder pain EXAM: CHEST - 2 VIEW COMPARISON:  None. FINDINGS: Right shoulder replacement. No focal opacity or pleural effusion. Borderline to mild cardiomegaly. Upper normal mediastinal shadow. No pneumothorax. IMPRESSION: No active cardiopulmonary disease.  Borderline to mild cardiomegaly Electronically Signed   By: Donavan Foil M.D.   On: 10/04/2020 16:46   DG Lumbar Spine 2-3 Views  Result Date:  10/04/2020 CLINICAL DATA:  Fall with back pain EXAM: LUMBAR SPINE - 2-3 VIEW COMPARISON:  None. FINDINGS: Five non rib-bearing lumbar type vertebra. Lumbar alignment within normal limits. Vertebral body heights are maintained. The disc spaces appear patent. Minimal degenerative osteophytes. IMPRESSION: No acute osseous abnormality Electronically Signed   By: Donavan Foil M.D.   On: 10/04/2020 16:46   DG Shoulder Right  Result Date: 10/04/2020 CLINICAL DATA:  Fall with shoulder pain EXAM: RIGHT SHOULDER - 2+ VIEW COMPARISON:  03/02/2020 FINDINGS: Reverse right shoulder replacement with intact hardware and normal alignment. No fracture is seen. IMPRESSION: Right shoulder arthroplasty without acute osseous abnormality. Electronically Signed   By: Donavan Foil M.D.   On: 10/04/2020 16:48   CT HEAD WO CONTRAST  Result Date: 10/04/2020 CLINICAL DATA:  Triage note: Pt comes into the ED via EMS from home, states her daughter found her in the floor, pt is unable to recall if she passed out an hour ago  or 12hrs ago. States she has been feeling weak and disoriented since yesterday.Pt c/o lower back pain and neck pain. States she is starting to remember falling going to the [place] this morning, states she fell hitting the side of the tube, hitting her head. States she remembers her grandson getting up to go to the gym this morningPt is disoriented to time, and place EXAM: CT HEAD WITHOUT CONTRAST CT CERVICAL SPINE WITHOUT CONTRAST TECHNIQUE: Multidetector CT imaging of the head and cervical spine was performed following the standard protocol without intravenous contrast. Multiplanar CT image reconstructions of the cervical spine were also generated. COMPARISON:  None. FINDINGS: CT HEAD FINDINGS Brain: No evidence of acute infarction, hemorrhage, hydrocephalus, extra-axial collection or mass lesion/mass effect. Vascular: No hyperdense vessel or unexpected calcification. Skull: Normal. Negative for fracture or focal lesion. Sinuses/Orbits: Visualized globes and orbits are unremarkable. The visualized sinuses are clear. Other: None. CT CERVICAL SPINE FINDINGS Alignment: Reversal of the normal cervical lordosis, apex at C6. No spondylolisthesis. Skull base and vertebrae: No acute fracture. No primary bone lesion or focal pathologic process. Soft tissues and spinal canal: No prevertebral fluid or swelling. No visible canal hematoma. Disc levels: Moderate loss of disc height endplate sclerosis and spurring and disc bulging at C5-C6 and C6-C7. Remaining cervical disc levels are well preserved. No evidence of a disc herniation. No significant stenosis. Upper chest: Negative. Other: None. IMPRESSION: HEAD CT 1. Normal. CERVICAL CT 1. No fracture or acute finding. 2. Reversed cervical lordosis. Disc degenerative changes at C5-C6 and C6-C7. Electronically Signed   By: Lajean Manes M.D.   On: 10/04/2020 15:30   CT Cervical Spine Wo Contrast  Result Date: 10/04/2020 CLINICAL DATA:  Triage note: Pt comes into the ED via EMS from  home, states her daughter found her in the floor, pt is unable to recall if she passed out an hour ago or 12hrs ago. States she has been feeling weak and disoriented since yesterday.Pt c/o lower back pain and neck pain. States she is starting to remember falling going to the BR this morning, states she fell hitting the side of the tube, hitting her head. States she remembers her grandson getting up to go to the gym this morningPt is disoriented to time, and place EXAM: CT HEAD WITHOUT CONTRAST CT CERVICAL SPINE WITHOUT CONTRAST TECHNIQUE: Multidetector CT imaging of the head and cervical spine was performed following the standard protocol without intravenous contrast. Multiplanar CT image reconstructions of the cervical spine were also generated. COMPARISON:  None. FINDINGS:  CT HEAD FINDINGS Brain: No evidence of acute infarction, hemorrhage, hydrocephalus, extra-axial collection or mass lesion/mass effect. Vascular: No hyperdense vessel or unexpected calcification. Skull: Normal. Negative for fracture or focal lesion. Sinuses/Orbits: Visualized globes and orbits are unremarkable. The visualized sinuses are clear. Other: None. CT CERVICAL SPINE FINDINGS Alignment: Reversal of the normal cervical lordosis, apex at C6. No spondylolisthesis. Skull base and vertebrae: No acute fracture. No primary bone lesion or focal pathologic process. Soft tissues and spinal canal: No prevertebral fluid or swelling. No visible canal hematoma. Disc levels: Moderate loss of disc height endplate sclerosis and spurring and disc bulging at C5-C6 and C6-C7. Remaining cervical disc levels are well preserved. No evidence of a disc herniation. No significant stenosis. Upper chest: Negative. Other: None. IMPRESSION: HEAD CT 1. Normal. CERVICAL CT 1. No fracture or acute finding. 2. Reversed cervical lordosis. Disc degenerative changes at C5-C6 and C6-C7. Electronically Signed   By: Lajean Manes M.D.   On: 10/04/2020 15:30   DG Humerus  Left  Result Date: 10/04/2020 CLINICAL DATA:  Fall with LEFT shoulder pain and low back pain. EXAM: LEFT HUMERUS - 2+ VIEW COMPARISON:  Contralateral shoulder. FINDINGS: There is no evidence of fracture or other focal bone lesions. Soft tissues are unremarkable. IMPRESSION: Negative evaluation of the LEFT humerus. Electronically Signed   By: Zetta Bills M.D.   On: 10/04/2020 16:45   Labs on Admission: I have personally reviewed following labs  CBC: Recent Labs  Lab 10/04/20 1359  WBC 10.9*  NEUTROABS 8.6*  HGB 11.3*  HCT 36.3  MCV 88.3  PLT 001   Basic Metabolic Panel: Recent Labs  Lab 10/04/20 1359  NA 136  K 5.0  CL 98  CO2 27  GLUCOSE 156*  BUN 43*  CREATININE 1.81*  CALCIUM 9.5   GFR: Estimated Creatinine Clearance: 40.9 mL/min (A) (by C-G formula based on SCr of 1.81 mg/dL (H)). Liver Function Tests: Recent Labs  Lab 10/04/20 1359  AST 29  ALT 17  ALKPHOS 147*  BILITOT 0.7  PROT 7.9  ALBUMIN 3.9   No results for input(s): LIPASE, AMYLASE in the last 168 hours. Recent Labs  Lab 10/04/20 1742  AMMONIA 17   Coagulation Profile: Recent Labs  Lab 10/04/20 1359  INR 1.0   Cardiac Enzymes: Recent Labs  Lab 10/04/20 1359  CKTOTAL 94   Mikaili Flippin N Tiki Tucciarone D.O. [facility]  If 7PM-7AM, please contact overnight-coverage provider If 7AM-7PM, please contact day coverage provider [email or web address]  10/04/2020, 7:54 PM

## 2020-10-04 NOTE — ED Notes (Signed)
Labs called to obtain ammonia and VBG due to limited patient vasculature.

## 2020-10-04 NOTE — ED Provider Notes (Signed)
[facility] Emergency Department Provider Note    Event Date/Time   First MD Initiated Contact with Patient 10/04/20 1504     (approximate)  I have reviewed the triage vital signs and the nursing notes.   HISTORY  Chief Complaint Loss of Consciousness    HPI Felicia Lane is a 65 y.o. female with the below listed past medical history presents to the ER for evaluation of multiple falls since last night.  Denies any lateralizing weakness.  Did recently start new diabetic medication Ozempic as well as was recently started on Flexeril for some muscle spasms in her neck.  She is complaining currently of left shoulder pain as well as low back pain.  Cannot remember if she hit her head.  Daughter states that she is more confused than at baseline.  No other new medications.    Past Medical History:  Diagnosis Date  . Anemia    H/O  . Anxiety   . Arthritis   . Cancer ([place])    SKIN CANCER-MELANOMA  . Complication of anesthesia   . Diabetes mellitus without complication ([place])   . Family history of adverse reaction to anesthesia    MOM-BP DROPPED VERY LOW  . Hypertension   . PONV (postoperative nausea and vomiting)    No family history on file. Past Surgical History:  Procedure Laterality Date  . ABDOMINAL HYSTERECTOMY    . CHOLECYSTECTOMY    . REVERSE SHOULDER ARTHROPLASTY Right 03/02/2020   Procedure: REVERSE SHOULDER ARTHROPLASTY;  Surgeon: Corky Mull, MD;  Location: [facility] ORS;  Service: Orthopedics;  Laterality: Right;  . TUBAL LIGATION     Patient Active Problem List   Diagnosis Date Noted  . Syncope 10/04/2020  . At risk for obstructive sleep apnea 10/04/2020  . Status post reverse total shoulder replacement, right 03/03/2020  . Primary osteoarthritis of right shoulder 12/31/2019  . Hypertension, essential 01/27/2019  . Obesity, Class III, BMI 40-49.9 (morbid obesity) ([place]) 01/27/2019  . Type 2 diabetes mellitus without complication,  without long-term current use of insulin ([place]) 01/27/2019      Prior to Admission medications   Medication Sig Start Date End Date Taking? Authorizing Provider  acetaminophen (TYLENOL) 500 MG tablet Take 500-1,000 mg by mouth every 6 (six) hours as needed (for pain.).   Yes [provider]  allopurinol (ZYLOPRIM) 100 MG tablet Take 100 mg by mouth at bedtime. 02/11/20  Yes [provider]  atorvastatin (LIPITOR) 40 MG tablet Take 40 mg by mouth every evening. 12/29/19  Yes [provider]  baclofen (LIORESAL) 10 MG tablet Take 10 mg by mouth 3 (three) times daily. 10/02/20  Yes [provider]  cetirizine (ZYRTEC) 10 MG tablet Take 10 mg by mouth daily.   Yes [provider]  Cholecalciferol (VITAMIN D3 PO) Take 1 tablet by mouth daily.   Yes [provider]  Cyanocobalamin (VITAMIN B-12 PO) Take 1 tablet by mouth daily.   Yes [provider]  DULoxetine (CYMBALTA) 30 MG capsule Take 60 mg by mouth at bedtime. 01/12/20  Yes [provider]  erythromycin ophthalmic ointment erythromycin 5 mg/gram (0.5 %) eye ointment   Yes [provider]  furosemide (LASIX) 20 MG tablet Take 20 mg by mouth daily. In the morning. 01/12/20  Yes [provider]  gabapentin (NEURONTIN) 600 MG tablet Take 600 mg by mouth every 8 (eight) hours. 02/15/20  Yes [provider]  glipiZIDE (GLUCOTROL) 10 MG tablet Take 10  mg by mouth 2 (two) times daily. 01/27/20  Yes [provider]  Glucosamine-Chondroitin (COSAMIN DS PO) Take 1 tablet by mouth at bedtime.   Yes [provider]  hydrochlorothiazide (HYDRODIURIL) 25 MG tablet Take 25 mg by mouth at bedtime. 01/20/20  Yes [provider]  lisinopril (ZESTRIL) 10 MG tablet Take 10 mg by mouth at bedtime. 10/14/19  Yes [provider]  meloxicam (MOBIC) 15 MG tablet Take 15 mg by mouth every evening. 02/10/20  Yes [provider]  metFORMIN  (GLUCOPHAGE-XR) 500 MG 24 hr tablet Take 500 mg by mouth 2 (two) times daily. 01/21/20  Yes [provider]  Multiple Vitamin (MULTIVITAMIN WITH MINERALS) TABS tablet Take 1 tablet by mouth at bedtime.   Yes [provider]  naproxen (NAPROSYN) 500 MG tablet Take 500 mg by mouth 2 (two) times daily. 10/02/20  Yes [provider]  Omega-3 Fatty Acids (FISH OIL) 1200 MG CAPS Take 1,200 mg by mouth at bedtime.   Yes [provider]  ondansetron (ZOFRAN-ODT) 4 MG disintegrating tablet Take 4 mg by mouth every 8 (eight) hours as needed. 10/02/20  Yes [provider]  sitaGLIPtin (JANUVIA) 100 MG tablet Take 100 mg by mouth at bedtime.   Yes [provider]  dicyclomine (BENTYL) 20 MG tablet dicyclomine 20 mg tablet  TAKE 1 TABLET BY MOUTH 4 TIMES A DAY FOR 7 DAYS Patient not taking: No sig reported    [provider]  methylPREDNISolone (MEDROL DOSEPAK) 4 MG TBPK tablet 6 day dose pack - take as directed Patient not taking: No sig reported 07/25/20   Edrick Kins, DPM  OZEMPIC, 0.25 OR 0.5 MG/DOSE, 2 MG/1.5ML SOPN Inject into the skin. 09/21/20   [provider]    Allergies Codeine and Shellfish allergy    Social History Social History   Tobacco Use  . Smoking status: Never Smoker  . Smokeless tobacco: Never Used  Vaping Use  . Vaping Use: Never used  Substance Use Topics  . Alcohol use: Not Currently  . Drug use: Never    Review of Systems Patient denies headaches, rhinorrhea, blurry vision, numbness, shortness of breath, chest pain, edema, cough, abdominal pain, nausea, vomiting, diarrhea, dysuria, fevers, rashes or hallucinations unless otherwise stated above in HPI. ____________________________________________   PHYSICAL EXAM:  VITAL SIGNS: Vitals:   10/04/20 1845 10/04/20 1900  BP:  (!) 151/63  Pulse: 80 81  Resp: 20 18  Temp:    SpO2: 100% 100%    Constitutional: Alert and oriented.  Eyes:  Conjunctivae are normal.  Head: Atraumatic. Nose: No congestion/rhinnorhea. Mouth/Throat: Mucous membranes are moist.   Neck: No stridor. Painless ROM.  Cardiovascular: Normal rate, regular rhythm. Grossly normal heart sounds.  Good peripheral circulation. Respiratory: Normal respiratory effort.  No retractions. Lungs CTAB. Gastrointestinal: Soft and nontender in all four quadrants. No distention. No abdominal bruits. No CVA tenderness. Genitourinary:  Musculoskeletal: No lower extremity tenderness nor edema.  No joint effusions. Neurologic:  Normal speech and language. No gross focal neurologic deficits are appreciated. No facial droop Skin:  Skin is warm, dry and intact. No rash noted. Psychiatric: Mood and affect are normal. Speech and behavior are normal.  ____________________________________________   LABS (all labs ordered are listed, but only abnormal results are displayed)  Results for orders placed or performed during the hospital encounter of 10/04/20 (from the past 24 hour(s))  CBC     Status: Abnormal   Collection Time: 10/04/20  1:59 PM  Result Value Ref Range   WBC 10.9 (H) 4.0 - 10.5 K/uL   RBC 4.11 3.87 - 5.11 MIL/uL   Hemoglobin 11.3 (L) 12.0 - 15.0 g/dL   HCT 36.3 36.0 - 46.0 %   MCV 88.3 80.0 - 100.0 fL   MCH 27.5 26.0 - 34.0 pg   MCHC 31.1 30.0 - 36.0 g/dL   RDW 16.4 (H) 11.5 - 15.5 %   Platelets 287 150 - 400 K/uL   nRBC 0.0 0.0 - 0.2 %  Differential     Status: Abnormal   Collection Time: 10/04/20  1:59 PM  Result Value Ref Range   Neutrophils Relative % 78 %   Neutro Abs 8.6 (H) 1.7 - 7.7 K/uL   Lymphocytes Relative 14 %   Lymphs Abs 1.6 0.7 - 4.0 K/uL   Monocytes Relative 5 %   Monocytes Absolute 0.5 0.1 - 1.0 K/uL   Eosinophils Relative 1 %   Eosinophils Absolute 0.1 0.0 - 0.5 K/uL   Basophils Relative 1 %   Basophils Absolute 0.1 0.0 - 0.1 K/uL   Immature Granulocytes 1 %   Abs Immature Granulocytes 0.05 0.00 - 0.07 K/uL  Comprehensive  metabolic panel     Status: Abnormal   Collection Time: 10/04/20  1:59 PM  Result Value Ref Range   Sodium 136 135 - 145 mmol/L   Potassium 5.0 3.5 - 5.1 mmol/L   Chloride 98 98 - 111 mmol/L   CO2 27 22 - 32 mmol/L   Glucose, Bld 156 (H) 70 - 99 mg/dL   BUN 43 (H) 8 - 23 mg/dL   Creatinine, Ser 1.81 (H) 0.44 - 1.00 mg/dL   Calcium 9.5 8.9 - 10.3 mg/dL   Total Protein 7.9 6.5 - 8.1 g/dL   Albumin 3.9 3.5 - 5.0 g/dL   AST 29 15 - 41 U/L   ALT 17 0 - 44 U/L   Alkaline Phosphatase 147 (H) 38 - 126 U/L   Total Bilirubin 0.7 0.3 - 1.2 mg/dL   GFR, Estimated 30 (L) >60 mL/min   Anion gap 11 5 - 15  Protime-INR     Status: None   Collection Time: 10/04/20  1:59 PM  Result Value Ref Range   Prothrombin Time 13.2 11.4 - 15.2 seconds   INR 1.0 0.8 - 1.2  APTT     Status: None   Collection Time: 10/04/20  1:59 PM  Result Value Ref Range   aPTT 31 24 - 36 seconds  CK     Status: None   Collection Time: 10/04/20  1:59 PM  Result Value Ref Range   Total CK 94 38 - 234 U/L  Troponin I (High Sensitivity)     Status: None   Collection Time: 10/04/20  1:59 PM  Result Value Ref Range   Troponin I (High Sensitivity) 3 <18 ng/L  Troponin I (High Sensitivity)     Status: None   Collection Time: 10/04/20  4:49 PM  Result Value Ref Range   Troponin I (High Sensitivity) 3 <18 ng/L  Blood gas, venous     Status: Abnormal (Preliminary result)   Collection Time: 10/04/20  5:42 PM  Result Value Ref Range   pH, Ven 7.26 7.250 - 7.430   pCO2, Ven 73 (HH) 44.0 - 60.0 mmHg   pO2, Ven PENDING 32.0 - 45.0 mmHg   Bicarbonate 32.8 (H) 20.0 - 28.0 mmol/L   Acid-Base Excess 1.7 0.0 - 2.0 mmol/L   O2 Saturation 32.6 %  Patient temperature 37.0    Collection site VEIN    Sample type VEIN   Ammonia     Status: None   Collection Time: 10/04/20  5:42 PM  Result Value Ref Range   Ammonia 17 9 - 35 umol/L   ____________________________________________  EKG My review and personal interpretation at Time:  13:34   Indication: fall  Rate: 75  Rhythm: sinus Axis: normal  Other: normal intervals, no stemi ____________________________________________  RADIOLOGY  I personally reviewed all radiographic images ordered to evaluate for the above acute complaints and reviewed radiology reports and findings.  These findings were personally discussed with the patient.  Please see medical record for radiology report.  ____________________________________________   PROCEDURES  Procedure(s) performed:  .Critical Care Performed by: Merlyn Lot, MD Authorized by: Merlyn Lot, MD   Critical care provider statement:    Critical care time (minutes):  35   Critical care time was exclusive of:  Separately billable procedures and treating other patients   Critical care was necessary to treat or prevent imminent or life-threatening deterioration of the following conditions:  Respiratory failure   Critical care was time spent personally by me on the following activities:  Development of treatment plan with patient or surrogate, discussions with consultants, evaluation of patient's response to treatment, examination of patient, obtaining history from patient or surrogate, ordering and performing treatments and interventions, ordering and review of laboratory studies, ordering and review of radiographic studies, pulse oximetry, re-evaluation of patient's condition and review of old charts      Critical Care performed: yes ____________________________________________   INITIAL IMPRESSION / [place] / ED COURSE  Pertinent labs & imaging results that were available during my care of the patient were reviewed by me and considered in my medical decision making (see chart for details).   DDX: Dehydration, sepsis, pna, uti, hypoglycemia, cva, drug effect, withdrawal, iph, sdh   Felicia Lane is a 65 y.o. who presents to the ED with agitation as described above.  Patient is somewhat  drowsy appearing having multiple falls.  CT imaging and x-rays ordered evaluate for traumatic injury shows no acute abnormality.  Seems less consistent with CVA.  More likely metabolic.  Blood work sent for the blood differential.  Clinical Course as of 10/04/20 2019  Wed Oct 04, 2020  1526 BP(!): 120/59 [PR]  1836 Patient noted to have hypercapnic respiratory failure.  May be polypharmacy causing respiratory suppression remainder blood work is otherwise reassuring.  Will place on BiPAP.  I do not appreciate any wheezing on exam husband was a heavy smoker and had severe COPD.  Will give neb.  Suspect polypharmacy contributing to presentation given recent rx for fleceril.  Case discussed with hospitalist for admission. Patient and family agreeable with plan. [PR]    Clinical Course User Index [PR] Merlyn Lot, MD    The patient was evaluated in Emergency Department today for the symptoms described in the history of present illness. He/she was evaluated in the context of the global COVID-19 pandemic, which necessitated consideration that the patient might be at risk for infection with the SARS-CoV-2 virus that causes COVID-19. Institutional protocols and algorithms that pertain to the evaluation of patients at risk for COVID-19 are in a state of rapid change based on information released by regulatory bodies including the CDC and federal and state organizations. These policies and algorithms were followed during the patient's care in the ED.  As part of my medical decision making, I reviewed  the following data within the [place] notes reviewed and incorporated, Labs reviewed, notes from prior ED visits and Standard Controlled Substance Database   ____________________________________________   FINAL CLINICAL IMPRESSION(S) / ED DIAGNOSES  Final diagnoses:  Syncope and collapse  Acute hypercapnic respiratory failure ([place])      NEW MEDICATIONS STARTED DURING THIS  VISIT:  New Prescriptions   No medications on file     Note:  This document was prepared using Dragon voice recognition software and may include unintentional dictation errors.    Merlyn Lot, MD 10/04/20 2020

## 2020-10-04 NOTE — ED Notes (Signed)
Patient returned to ED 9 from MRI

## 2020-10-05 ENCOUNTER — Inpatient Hospital Stay (HOSPITAL_BASED_OUTPATIENT_CLINIC_OR_DEPARTMENT_OTHER)
Admit: 2020-10-05 | Discharge: 2020-10-05 | Disposition: A | Discharge status: Home / Self Care | Payer: Medicare HMO | Attending: Internal Medicine | Admitting: Internal Medicine

## 2020-10-05 ENCOUNTER — Inpatient Hospital Stay: Payer: Medicare HMO

## 2020-10-05 DIAGNOSIS — R55 Syncope and collapse: Secondary | ICD-10-CM | POA: Diagnosis not present

## 2020-10-05 LAB — URINALYSIS, COMPLETE (UACMP) WITH MICROSCOPIC
Bilirubin Urine: NEGATIVE
Glucose, UA: NEGATIVE mg/dL
Hgb urine dipstick: NEGATIVE
Ketones, ur: NEGATIVE mg/dL
Leukocytes,Ua: NEGATIVE
Nitrite: NEGATIVE
Protein, ur: NEGATIVE mg/dL
Specific Gravity, Urine: 1.012 (ref 1.005–1.030)
pH: 5 (ref 5.0–8.0)

## 2020-10-05 LAB — GLUCOSE, CAPILLARY
Glucose-Capillary: 164 mg/dL — ABNORMAL HIGH (ref 70–99)
Glucose-Capillary: 187 mg/dL — ABNORMAL HIGH (ref 70–99)
Glucose-Capillary: 77 mg/dL (ref 70–99)
Glucose-Capillary: 120 mg/dL — ABNORMAL HIGH (ref 70–99)

## 2020-10-05 LAB — CBC
HCT: 32 % — ABNORMAL LOW (ref 36.0–46.0)
Hemoglobin: 9.9 g/dL — ABNORMAL LOW (ref 12.0–15.0)
MCH: 27.6 pg (ref 26.0–34.0)
MCHC: 30.9 g/dL (ref 30.0–36.0)
MCV: 89.1 fL (ref 80.0–100.0)
Platelets: 226 10*3/uL (ref 150–400)
RBC: 3.59 MIL/uL — ABNORMAL LOW (ref 3.87–5.11)
RDW: 16.3 % — ABNORMAL HIGH (ref 11.5–15.5)
WBC: 8.7 10*3/uL (ref 4.0–10.5)
nRBC: 0 % (ref 0.0–0.2)

## 2020-10-05 LAB — ECHOCARDIOGRAM COMPLETE
AR max vel: 3.56 cm2
AV Area VTI: 3.36 cm2
AV Area mean vel: 3.18 cm2
AV Mean grad: 8 mmHg
AV Peak grad: 11.4 mmHg
Ao pk vel: 1.69 m/s
Area-P 1/2: 3.83 cm2
Height: 65 in
MV VTI: 3.93 cm2
S' Lateral: 3.1 cm
Weight: 4384.51 oz

## 2020-10-05 LAB — URINE DRUG SCREEN, QUALITATIVE (ARMC ONLY)
Amphetamines, Ur Screen: NOT DETECTED
Barbiturates, Ur Screen: NOT DETECTED
Benzodiazepine, Ur Scrn: NOT DETECTED
Cannabinoid 50 Ng, Ur ~~LOC~~: NOT DETECTED
Cocaine Metabolite,Ur ~~LOC~~: NOT DETECTED
MDMA (Ecstasy)Ur Screen: NOT DETECTED
Methadone Scn, Ur: NOT DETECTED
Opiate, Ur Screen: NOT DETECTED
Phencyclidine (PCP) Ur S: NOT DETECTED
Tricyclic, Ur Screen: NOT DETECTED

## 2020-10-05 LAB — BASIC METABOLIC PANEL
Anion gap: 8 (ref 5–15)
BUN: 40 mg/dL — ABNORMAL HIGH (ref 8–23)
CO2: 30 mmol/L (ref 22–32)
Calcium: 8.7 mg/dL — ABNORMAL LOW (ref 8.9–10.3)
Chloride: 101 mmol/L (ref 98–111)
Creatinine, Ser: 1.52 mg/dL — ABNORMAL HIGH (ref 0.44–1.00)
GFR, Estimated: 37 mL/min — ABNORMAL LOW (ref >60)
Glucose, Bld: 165 mg/dL — ABNORMAL HIGH (ref 70–99)
Potassium: 4.2 mmol/L (ref 3.5–5.1)
Sodium: 139 mmol/L (ref 135–145)

## 2020-10-05 LAB — MRSA PCR SCREENING: MRSA by PCR: NEGATIVE

## 2020-10-05 LAB — HEMOGLOBIN A1C
Hgb A1c MFr Bld: 8.5 % — ABNORMAL HIGH (ref 4.8–5.6)
Mean Plasma Glucose: 197.25 mg/dL

## 2020-10-05 LAB — HIV ANTIBODY (ROUTINE TESTING W REFLEX): HIV Screen 4th Generation wRfx: NONREACTIVE

## 2020-10-05 MED ORDER — PERFLUTREN LIPID MICROSPHERE
1.0000 mL | INTRAVENOUS | Status: AC | PRN
  Administered 2020-10-05: 2 mL via INTRAVENOUS
  Filled 2020-10-05: qty 10

## 2020-10-05 MED ORDER — FAMOTIDINE 20 MG PO TABS
20.0000 mg | ORAL_TABLET | Freq: Every day | ORAL | Status: DC
  Administered 2020-10-05: 20 mg via ORAL
  Filled 2020-10-05: qty 1

## 2020-10-05 MED ORDER — HYDRALAZINE HCL 20 MG/ML IJ SOLN: 10.0000 mg | INTRAMUSCULAR | Status: DC | PRN

## 2020-10-05 MED ORDER — IOHEXOL 350 MG/ML SOLN
60.0000 mL | Freq: Once | INTRAVENOUS | Status: AC | PRN
  Administered 2020-10-05: 60 mL via INTRAVENOUS

## 2020-10-05 MED ORDER — ALUM & MAG HYDROXIDE-SIMETH 200-200-20 MG/5ML PO SUSP
30.0000 mL | ORAL | Status: DC | PRN
  Administered 2020-10-05: 30 mL via ORAL
  Filled 2020-10-05: qty 30

## 2020-10-05 NOTE — Progress Notes (Signed)
PROGRESS NOTE    Felicia Lane  KGU:[id number] DOB: [birth year] DOA: 10/04/2020 PCP: [name], FNP   Brief Narrative:  65 y.o. female with medical history significant for obesity, depression, hypertension, non-insulin-dependent diabetes mellitus, at risk for obstructive sleep apnea, depression, hyperlipidemia, presents to the emergency department for chief concerns of syncope.  Per patient she syncopized early in the morning when there was minimal light out on her way to the bathroom.  She endorsed hitting her head.  She endorsed loss of consciousness however was not able to tell me how long she was out.  She had another episode of syncope late morning and her grandson was aware.  He tried to go into the restroom to help her however she woke up and states that she can take care of herself and got up on her feet.  She had another episode of syncope around lunchtime.  Another daughter found her laying down in front of her bedroom door next to her bed.  Daughter at bedside who is an Therapist, sports, denies fever.  Daughter does endorse that patient has had poor p.o. intake due to diarrhea and nausea over the weekend.  Daughter states that her mother has been feeling poorly for the last few days.  Patient does endorse 1 episode of vomiting over the weekend.   The etiology of the syncopal event is unclear however I do suspect underlying obstructive sleep apnea/obesity hypoventilation syndrome.  I suspect these "syncopal events" may be due to patient falling asleep rather than actually syncopized in.  CT PE study negative for acute pulmonary embolism.  Remainder of work-up overall unrevealing.  Echocardiogram completed and read pending.   Assessment & Plan:   Principal Problem:   Syncope Active Problems:   Hypertension, essential   Obesity, Class III, BMI 40-49.9 (morbid obesity) (HCC)   Status post reverse total shoulder replacement, right   Type 2 diabetes mellitus without complication, without  long-term current use of insulin (HCC)   At risk for obstructive sleep apnea  Multiple syncopal event Differentials include intravascular volume depletion, vasovagal syncope, arrhythmia genic syncope Also continue to suspect underlying OSA/OHS Gastroenteritis lower on differential Patient with no diarrhea no white count since admission CT angiogram negative for pulmonary embolism MRI brain negative UA clean UDS negative BNP normal Plan: Follow-up echo Twice daily orthostatics Therapy evaluations Outpatient referral to pulmonology for sleep study As needed NIPPV while admitted  Hypertension Held lisinopril and hydrochlorothiazide given AKI As needed IV hydralazine  Hyperlipidemia atorvastatin 40 mg nightly  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 creatinine If improved can discontinue fluids at that time  Gout allopurinol 100 mg nightly resumed  Non-insulin-dependent diabetes mellitus Hold oral agents as patient had contrast exposure Sliding scale insulin with nightly coverage  Depression/anxiety duloxetine 60 mg nightly   DVT prophylaxis: SQ Lovenox Code Status: Full Family Communication: Daughter at bedside Disposition Plan: Status is: Inpatient  Remains inpatient appropriate because:Inpatient level of care appropriate due to severity of illness   Dispo: The patient is from: Home              Anticipated d/c is to: Unclear at this time.  SNF versus home with home health              Patient currently is not medically  stable to d/c.   Difficult to place patient No  Multiple syncopal events of unclear etiology.  Continue suspect underlying obstructive sleep apnea/obesity hypoventilation     Level of care: Stepdown  Consultants:    None   Procedures: BiPAP  Antimicrobials:   None   Subjective: Patient seen and examined.  Resting comfortably in bed.  Answers all questions appropriately.  In no visible distress.  Objective: Vitals:   10/05/20 0534 10/05/20 0600 10/05/20 0800 10/05/20 1235  BP: (!) 131/55 137/61 128/64 125/70  Pulse: 77 78 75 88  Resp: 17 17 18 20   Temp:      TempSrc:      SpO2: 99% 100% 100% 99%  Weight:      Height:        Intake/Output Summary (Last 24 hours) at 10/05/2020 1503 Last data filed at 10/05/2020 1341 Gross per 24 hour  Intake 2067.34 ml  Output 1800 ml  Net 267.34 ml   Filed Weights   10/04/20 1334 10/05/20 0408  Weight: 129.3 kg 124.3 kg    Examination:  General exam: Appears calm and comfortable  Respiratory system: Bibasilar crackles.  Normal work of breathing.  Room air Cardiovascular system: S1-S2, regular rate and rhythm, no murmurs, trace pedal edema Gastrointestinal system: Obese, nontender, nondistended, normal bowel sounds  Central nervous system: Alert and oriented. No focal neurological deficits. Extremities: Symmetric 5 x 5 power. Skin: No rashes, lesions or ulcers Psychiatry: Judgement and insight appear normal. Mood & affect appropriate.     Data Reviewed: I have personally reviewed following labs and imaging studies  CBC: Recent Labs  Lab 10/04/20 1359 10/05/20 0451  WBC 10.9* 8.7  NEUTROABS 8.6*  --   HGB 11.3* 9.9*  HCT 36.3 32.0*  MCV 88.3 89.1  PLT 287 [id number]   Basic Metabolic Panel: Recent Labs  Lab 10/04/20 1359 10/05/20 0451  NA 136 139  K 5.0 4.2  CL 98 101  CO2 27 30  GLUCOSE 156* 165*  BUN 43* 40*  CREATININE 1.81* 1.52*  CALCIUM 9.5 8.7*   GFR: Estimated Creatinine Clearance: 47.6 mL/min (A) (by C-G formula based on SCr of 1.52 mg/dL (H)). Liver Function Tests: Recent Labs  Lab 10/04/20 1359  AST 29  ALT 17  ALKPHOS 147*  BILITOT 0.7  PROT 7.9  ALBUMIN 3.9   No results for input(s): LIPASE, AMYLASE in  the last 168 hours. Recent Labs  Lab 10/04/20 1742  AMMONIA 17   Coagulation Profile: Recent Labs  Lab 10/04/20 1359  INR 1.0   Cardiac Enzymes: Recent Labs  Lab 10/04/20 1359  CKTOTAL 94   BNP (last 3 results) No results for input(s): PROBNP in the last 8760 hours. HbA1C: Recent Labs    10/04/20 2258  HGBA1C 8.5*   CBG: Recent Labs  Lab 10/04/20 2226 10/05/20 0733 10/05/20 1118  GLUCAP 70 164* 187*   Lipid Profile: No results for input(s): CHOL, HDL, LDLCALC, TRIG, CHOLHDL, LDLDIRECT in the last 72 hours. Thyroid Function Tests: Recent Labs    10/04/20 2258  TSH 1.280   Anemia Panel: No results for input(s): VITAMINB12, FOLATE, FERRITIN, TIBC, IRON, RETICCTPCT in the last 72 hours. Sepsis Labs: No results for input(s): PROCALCITON, LATICACIDVEN in the last 168 hours.  Recent Results (from the past 240 hour(s))  Resp Panel by RT-PCR (Flu A&B, Covid) Nasopharyngeal Swab     Status: None   Collection Time: 10/04/20  8:28 PM   Specimen: Nasopharyngeal Swab; Nasopharyngeal(NP) swabs in vial transport  medium  Result Value Ref Range Status   SARS Coronavirus 2 by RT PCR NEGATIVE NEGATIVE Final    Comment: (NOTE) SARS-CoV-2 target nucleic acids are NOT DETECTED.  The SARS-CoV-2 RNA is generally detectable in upper respiratory specimens during the acute phase of infection. The lowest concentration of SARS-CoV-2 viral copies this assay can detect is 138 copies/mL. A negative result does not preclude SARS-Cov-2 infection and should not be used as the sole basis for treatment or other patient management decisions. A negative result may occur with  improper specimen collection/handling, submission of specimen other than nasopharyngeal swab, presence of viral mutation(s) within the areas targeted by this assay, and inadequate number of viral copies(<138 copies/mL). A negative result must be combined with clinical observations, patient history, and  epidemiological information. The expected result is Negative.  Fact Sheet for Patients:  EntrepreneurPulse.com.au  Fact Sheet for Healthcare Providers:  IncredibleEmployment.be  This test is no t yet approved or cleared by the [place] FDA and  has been authorized for detection and/or diagnosis of SARS-CoV-2 by FDA under an Emergency Use Authorization (EUA). This EUA will remain  in effect (meaning this test can be used) for the duration of the COVID-19 declaration under Section 564(b)(1) of the Act, 21 U.S.C.section 360bbb-3(b)(1), unless the authorization is terminated  or revoked sooner.       Influenza A by PCR NEGATIVE NEGATIVE Final   Influenza B by PCR NEGATIVE NEGATIVE Final    Comment: (NOTE) The Xpert Xpress SARS-CoV-2/FLU/RSV plus assay is intended as an aid in the diagnosis of influenza from Nasopharyngeal swab specimens and should not be used as a sole basis for treatment. Nasal washings and aspirates are unacceptable for Xpert Xpress SARS-CoV-2/FLU/RSV testing.  Fact Sheet for Patients: EntrepreneurPulse.com.au  Fact Sheet for Healthcare Providers: IncredibleEmployment.be  This test is not yet approved or cleared by the [place] FDA and has been authorized for detection and/or diagnosis of SARS-CoV-2 by FDA under an Emergency Use Authorization (EUA). This EUA will remain in effect (meaning this test can be used) for the duration of the COVID-19 declaration under Section 564(b)(1) of the Act, 21 U.S.C. section 360bbb-3(b)(1), unless the authorization is terminated or revoked.  Performed at [facility], [address] [place], [place] [address]   MRSA PCR Screening     Status: None   Collection Time: 10/04/20 11:00 PM   Specimen: Nasal Mucosa; Nasopharyngeal  Result Value Ref Range Status   MRSA by PCR NEGATIVE NEGATIVE Final    Comment:        The GeneXpert MRSA  Assay (FDA approved for NASAL specimens only), is one component of a comprehensive MRSA colonization surveillance program. It is not intended to diagnose MRSA infection nor to guide or monitor treatment for MRSA infections. Performed at [facility], [address], [place], [place] [address]          Radiology Studies: DG Chest 2 View  Result Date: 10/04/2020 CLINICAL DATA:  Fall with left-sided shoulder pain EXAM: CHEST - 2 VIEW COMPARISON:  None. FINDINGS: Right shoulder replacement. No focal opacity or pleural effusion. Borderline to mild cardiomegaly. Upper normal mediastinal shadow. No pneumothorax. IMPRESSION: No active cardiopulmonary disease.  Borderline to mild cardiomegaly Electronically Signed   By: Donavan Foil M.D.   On: 10/04/2020 16:46   DG Lumbar Spine 2-3 Views  Result Date: 10/04/2020 CLINICAL DATA:  Fall with back pain EXAM: LUMBAR SPINE - 2-3 VIEW COMPARISON:  None. FINDINGS: Five non rib-bearing lumbar  type vertebra. Lumbar alignment within normal limits. Vertebral body heights are maintained. The disc spaces appear patent. Minimal degenerative osteophytes. IMPRESSION: No acute osseous abnormality Electronically Signed   By: Donavan Foil M.D.   On: 10/04/2020 16:46   DG Shoulder Right  Result Date: 10/04/2020 CLINICAL DATA:  Fall with shoulder pain EXAM: RIGHT SHOULDER - 2+ VIEW COMPARISON:  03/02/2020 FINDINGS: Reverse right shoulder replacement with intact hardware and normal alignment. No fracture is seen. IMPRESSION: Right shoulder arthroplasty without acute osseous abnormality. Electronically Signed   By: Donavan Foil M.D.   On: 10/04/2020 16:48   CT HEAD WO CONTRAST  Result Date: 10/04/2020 CLINICAL DATA:  Triage note: Pt comes into the ED via EMS from home, states her daughter found her in the floor, pt is unable to recall if she passed out an hour ago or 12hrs ago. States she has been feeling weak and disoriented since yesterday.Pt c/o lower back  pain and neck pain. States she is starting to remember falling going to the BR this morning, states she fell hitting the side of the tube, hitting her head. States she remembers her grandson getting up to go to the gym this morningPt is disoriented to time, and place EXAM: CT HEAD WITHOUT CONTRAST CT CERVICAL SPINE WITHOUT CONTRAST TECHNIQUE: Multidetector CT imaging of the head and cervical spine was performed following the standard protocol without intravenous contrast. Multiplanar CT image reconstructions of the cervical spine were also generated. COMPARISON:  None. FINDINGS: CT HEAD FINDINGS Brain: No evidence of acute infarction, hemorrhage, hydrocephalus, extra-axial collection or mass lesion/mass effect. Vascular: No hyperdense vessel or unexpected calcification. Skull: Normal. Negative for fracture or focal lesion. Sinuses/Orbits: Visualized globes and orbits are unremarkable. The visualized sinuses are clear. Other: None. CT CERVICAL SPINE FINDINGS Alignment: Reversal of the normal cervical lordosis, apex at C6. No spondylolisthesis. Skull base and vertebrae: No acute fracture. No primary bone lesion or focal pathologic process. Soft tissues and spinal canal: No prevertebral fluid or swelling. No visible canal hematoma. Disc levels: Moderate loss of disc height endplate sclerosis and spurring and disc bulging at C5-C6 and C6-C7. Remaining cervical disc levels are well preserved. No evidence of a disc herniation. No significant stenosis. Upper chest: Negative. Other: None. IMPRESSION: HEAD CT 1. Normal. CERVICAL CT 1. No fracture or acute finding. 2. Reversed cervical lordosis. Disc degenerative changes at C5-C6 and C6-C7. Electronically Signed   By: Lajean Manes M.D.   On: 10/04/2020 15:30   CT ANGIO CHEST PE W OR WO CONTRAST  Result Date: 10/05/2020 CLINICAL DATA:  Recent syncopal episode EXAM: CT ANGIOGRAPHY CHEST WITH CONTRAST TECHNIQUE: Multidetector CT imaging of the chest was performed using the  standard protocol during bolus administration of intravenous contrast. Multiplanar CT image reconstructions and MIPs were obtained to evaluate the vascular anatomy. CONTRAST:  103mL OMNIPAQUE IOHEXOL 350 MG/ML SOLN COMPARISON:  None. FINDINGS: Cardiovascular: Thoracic aorta and its branches demonstrate atherosclerotic calcifications. No aneurysmal dilatation or dissection is noted. No cardiac enlargement is seen. The pulmonary artery shows a normal enhancement pattern and normal branching pattern. No filling defect to suggest pulmonary embolism is noted. Mediastinum/Nodes: Thoracic inlet is within normal limits. No hilar or mediastinal adenopathy is noted. The esophagus as visualized is within normal limits. Lungs/Pleura: Lungs are well aerated bilaterally. No focal infiltrate or sizable effusion is seen. Upper Abdomen: Visualized upper abdomen shows no acute abnormality. Musculoskeletal: No acute bony abnormality is noted. Prior right shoulder replacement is seen. Review of the MIP images confirms  the above findings. IMPRESSION: No evidence of pulmonary emboli. No acute abnormality seen. Aortic Atherosclerosis (ICD10-I70.0). Electronically Signed   By: Inez Catalina M.D.   On: 10/05/2020 12:26   CT Cervical Spine Wo Contrast  Result Date: 10/04/2020 CLINICAL DATA:  Triage note: Pt comes into the ED via EMS from home, states her daughter found her in the floor, pt is unable to recall if she passed out an hour ago or 12hrs ago. States she has been feeling weak and disoriented since yesterday.Pt c/o lower back pain and neck pain. States she is starting to remember falling going to the BR this morning, states she fell hitting the side of the tube, hitting her head. States she remembers her grandson getting up to go to the gym this morningPt is disoriented to time, and place EXAM: CT HEAD WITHOUT CONTRAST CT CERVICAL SPINE WITHOUT CONTRAST TECHNIQUE: Multidetector CT imaging of the head and cervical spine was performed  following the standard protocol without intravenous contrast. Multiplanar CT image reconstructions of the cervical spine were also generated. COMPARISON:  None. FINDINGS: CT HEAD FINDINGS Brain: No evidence of acute infarction, hemorrhage, hydrocephalus, extra-axial collection or mass lesion/mass effect. Vascular: No hyperdense vessel or unexpected calcification. Skull: Normal. Negative for fracture or focal lesion. Sinuses/Orbits: Visualized globes and orbits are unremarkable. The visualized sinuses are clear. Other: None. CT CERVICAL SPINE FINDINGS Alignment: Reversal of the normal cervical lordosis, apex at C6. No spondylolisthesis. Skull base and vertebrae: No acute fracture. No primary bone lesion or focal pathologic process. Soft tissues and spinal canal: No prevertebral fluid or swelling. No visible canal hematoma. Disc levels: Moderate loss of disc height endplate sclerosis and spurring and disc bulging at C5-C6 and C6-C7. Remaining cervical disc levels are well preserved. No evidence of a disc herniation. No significant stenosis. Upper chest: Negative. Other: None. IMPRESSION: HEAD CT 1. Normal. CERVICAL CT 1. No fracture or acute finding. 2. Reversed cervical lordosis. Disc degenerative changes at C5-C6 and C6-C7. Electronically Signed   By: Lajean Manes M.D.   On: 10/04/2020 15:30   MR BRAIN WO CONTRAST  Result Date: 10/04/2020 CLINICAL DATA:  Syncope EXAM: MRI HEAD WITHOUT CONTRAST TECHNIQUE: Multiplanar, multiecho pulse sequences of the brain and surrounding structures were obtained without intravenous contrast. COMPARISON:  None. FINDINGS: Brain: No acute infarct, mass effect or extra-axial collection. No acute or chronic hemorrhage. Normal white matter signal, parenchymal volume and CSF spaces. The midline structures are normal. Vascular: Major flow voids are preserved. Skull and upper cervical spine: Normal calvarium and skull base. Visualized upper cervical spine and soft tissues are normal.  Sinuses/Orbits:No paranasal sinus fluid levels or advanced mucosal thickening. No mastoid or middle ear effusion. Normal orbits. IMPRESSION: Normal brain MRI. Electronically Signed   By: Ulyses Jarred M.D.   On: 10/04/2020 21:19   DG Humerus Left  Result Date: 10/04/2020 CLINICAL DATA:  Fall with LEFT shoulder pain and low back pain. EXAM: LEFT HUMERUS - 2+ VIEW COMPARISON:  Contralateral shoulder. FINDINGS: There is no evidence of fracture or other focal bone lesions. Soft tissues are unremarkable. IMPRESSION: Negative evaluation of the LEFT humerus. Electronically Signed   By: Zetta Bills M.D.   On: 10/04/2020 16:45        Scheduled Meds: . allopurinol  100 mg Oral QHS  . atorvastatin  40 mg Oral QPM  . Chlorhexidine Gluconate Cloth  6 each Topical Daily  . DULoxetine  60 mg Oral QHS  . heparin  5,000 Units Subcutaneous Q8H  .  insulin aspart  0-5 Units Subcutaneous QHS  . insulin aspart  0-9 Units Subcutaneous TID WC  . linagliptin  5 mg Oral Daily  . mouth rinse  15 mL Mouth Rinse BID  . sodium chloride flush  3 mL Intravenous Q12H  . vitamin B-12  1,000 mcg Oral Daily   Continuous Infusions: . sodium chloride 75 mL/hr at 10/05/20 1028     LOS: 1 day    Time spent: 25 minutes    Sidney Ace, MD [facility] Pager [phone]  If 7PM-7AM, please contact night-coverage 10/05/2020, 3:03 PM

## 2020-10-05 NOTE — Progress Notes (Signed)
*  PRELIMINARY RESULTS* Echocardiogram 2D Echocardiogram has been performed.  Felicia Lane 10/05/2020, 10:51 AM

## 2020-10-05 NOTE — Progress Notes (Signed)
Pt admitted from ED overnight. Daughter, Rojelio Brenner, at bedside. VS stable, AOx4. Bipap restarted overnight, repeat VBG this AM improved. Elevated D-dimer noted - MD informed, for possible CTA today. No other issues noted, no complaints verbalized.

## 2020-10-06 ENCOUNTER — Inpatient Hospital Stay: Payer: Medicare HMO

## 2020-10-06 DIAGNOSIS — R55 Syncope and collapse: Secondary | ICD-10-CM | POA: Diagnosis not present

## 2020-10-06 LAB — GLUCOSE, CAPILLARY
Glucose-Capillary: 149 mg/dL — ABNORMAL HIGH (ref 70–99)
Glucose-Capillary: 237 mg/dL — ABNORMAL HIGH (ref 70–99)
Glucose-Capillary: 132 mg/dL — ABNORMAL HIGH (ref 70–99)

## 2020-10-06 MED ORDER — GABAPENTIN 600 MG PO TABS
600.0000 mg | ORAL_TABLET | Freq: Three times a day (TID) | ORAL | Status: DC
  Administered 2020-10-06: 600 mg via ORAL
  Filled 2020-10-06 (×3): qty 1

## 2020-10-06 NOTE — Discharge Summary (Signed)
Physician Discharge Summary  Felicia Lane [id number] DOB: 04-07-1956 DOA: 10/04/2020  PCP: Felicia Pac, FNP  Admit date: 10/04/2020 Discharge date: 10/06/2020  Admitted From: Home Disposition: Home with home health  Recommendations for Outpatient Follow-up:  1. Follow up with PCP in 1-2 weeks 2. Follow-up with pulmonary Dr. Lanney Lane as directed.  His office will schedule follow-up visit  Home Health: Yes, PT Equipment/Devices: None Discharge Condition: Stable CODE STATUS: Full Diet recommendation: Heart Healthy  Brief/Interim Summary: 65 y.o.femalewith medical history significant forobesity, depression, hypertension, non-insulin-dependent diabetes mellitus, at risk for obstructive sleep apnea, depression, hyperlipidemia, presents to the emergency department for chief concerns of syncope.  Per patient she syncopized early in the morning whentherewas minimallight out on her way to the bathroom. She endorsed hitting her head. Sheendorsed loss of consciousness however was not able to tell me how long she was out.Shehad another episode of syncopelate morning and her grandson was aware. He tried to go into the restroom to help her however she woke up and states that she can take care of herself and got up on her feet. Shehad another episode of syncope aroundlunchtime. Another daughter found her laying down in front of herbedroom door next to her bed.  Daughter at bedside who is an Therapist, sports, denies fever. Daughter does endorse that patient has had poor p.o. intake due to diarrhea and nauseaover the weekend.Daughter states that her mother has been feeling poorly for the last few days.Patient does endorse 1 episode of vomiting over the weekend.   The etiology of the syncopal event is unclear however I do suspect underlying obstructive sleep apnea/obesity hypoventilation syndrome.  I suspect these "syncopal events" may be due to patient falling asleep rather than  actually syncopized in.  CT PE study negative for acute pulmonary embolism.  Remainder of work-up overall unrevealing.  Echocardiogram demonstrates grade 1 diastolic dysfunction with normal ejection fraction and no wall motion abnormalities  At time of discharge patient is stable no distress.  She is mentating clearly.  I have strong suspicion that she has underlying sleep apnea/obesity hypoventilation syndrome.  I have sent a message to Dr. Lanney Lane from [facility] pulmonology.  He is graciously agreed to see patient in consultation and will set up patient to see him in his office post discharge.  Discharge Diagnoses:  Principal Problem:   Syncope Active Problems:   Hypertension, essential   Obesity, Class III, BMI 40-49.9 (morbid obesity) (HCC)   Status post reverse total shoulder replacement, right   Type 2 diabetes mellitus without complication, without long-term current use of insulin (HCC)   At risk for obstructive sleep apnea  Multiple syncopal event Differentials include intravascular volume depletion, vasovagal syncope, arrhythmia genic syncope Also continue to suspect underlying OSA/OHS Gastritis ruled out CT angiogram negative for pulmonary embolism MRI brain negative UA clean UDS negative BNP normal TTE normal EF, grade 1 diastolic dysfunction, no wall motion abnormalities Discharge plan: Okay for discharge home Outpatient referral to pulmonology for sleep apnea evaluation  Hypertension Can resume home regimen on discharge  Hyperlipidemia atorvastatin 40 mg nightly  CKD 3B versus acute kidney injury last serum creatinine was also 1.81 however previous labs from 5 years ago patient had normal renal function Had some IV fluids overnight Creatinine improving Plan: Can resume home regimen on discharge  Gout allopurinol 100 mg nightly resumed  Non-insulin-dependent diabetes mellitus Can resume home regimen on discharge  Depression/anxiety duloxetine 60  mg nightly    Discharge Instructions  Discharge Instructions  Diet - low sodium heart healthy   Complete by: As directed    Increase activity slowly   Complete by: As directed      Allergies as of 10/06/2020      Reactions   Codeine Itching   Impairs renal function (itching in throat)   Shellfish Allergy Nausea Only, Other (See Comments)   Sick on stomach      Medication List    STOP taking these medications   dicyclomine 20 MG tablet Commonly known as: BENTYL   methylPREDNISolone 4 MG Tbpk tablet Commonly known as: MEDROL DOSEPAK     TAKE these medications   acetaminophen 500 MG tablet Commonly known as: TYLENOL Take 500-1,000 mg by mouth every 6 (six) hours as needed (for pain.).   allopurinol 100 MG tablet Commonly known as: ZYLOPRIM Take 100 mg by mouth at bedtime.   atorvastatin 40 MG tablet Commonly known as: LIPITOR Take 40 mg by mouth every evening.   baclofen 10 MG tablet Commonly known as: LIORESAL Take 10 mg by mouth 3 (three) times daily.   cetirizine 10 MG tablet Commonly known as: ZYRTEC Take 10 mg by mouth daily.   COSAMIN DS PO Take 1 tablet by mouth at bedtime.   DULoxetine 30 MG capsule Commonly known as: CYMBALTA Take 60 mg by mouth at bedtime.   erythromycin ophthalmic ointment erythromycin 5 mg/gram (0.5 %) eye ointment   Fish Oil 1200 MG Caps Take 1,200 mg by mouth at bedtime.   furosemide 20 MG tablet Commonly known as: LASIX Take 20 mg by mouth daily. In the morning.   gabapentin 600 MG tablet Commonly known as: NEURONTIN Take 600 mg by mouth every 8 (eight) hours.   glipiZIDE 10 MG tablet Commonly known as: GLUCOTROL Take 10 mg by mouth 2 (two) times daily.   hydrochlorothiazide 25 MG tablet Commonly known as: HYDRODIURIL Take 25 mg by mouth at bedtime.   lisinopril 10 MG tablet Commonly known as: ZESTRIL Take 10 mg by mouth at bedtime.   meloxicam 15 MG tablet Commonly known as: MOBIC Take 15 mg by mouth  every evening.   metFORMIN 500 MG 24 hr tablet Commonly known as: GLUCOPHAGE-XR Take 500 mg by mouth 2 (two) times daily.   multivitamin with minerals Tabs tablet Take 1 tablet by mouth at bedtime.   naproxen 500 MG tablet Commonly known as: NAPROSYN Take 500 mg by mouth 2 (two) times daily.   ondansetron 4 MG disintegrating tablet Commonly known as: ZOFRAN-ODT Take 4 mg by mouth every 8 (eight) hours as needed.   Ozempic (0.25 or 0.5 MG/DOSE) 2 MG/1.5ML Sopn Generic drug: Semaglutide(0.25 or 0.5MG /DOS) Inject into the skin.   sitaGLIPtin 100 MG tablet Commonly known as: JANUVIA Take 100 mg by mouth at bedtime.   VITAMIN B-12 PO Take 1 tablet by mouth daily.   VITAMIN D3 PO Take 1 tablet by mouth daily.       Follow-up Information    Felicia Lane, [place]. Schedule an appointment as soon as possible for a visit in 1 week(s).   Specialty: Family Medicine Contact information: [place] [address] [phone]        Ottie Glazier, MD Follow up.   Specialty: Pulmonary Disease Why: Dr. Lanney Lane will see you in clinic for follow-up visit.  If you do not hear from his office scheduling department please contact them. Contact information: [place] [address] [phone]  Allergies  Allergen Reactions  . Codeine Itching    Impairs renal function (itching in throat)  . Shellfish Allergy Nausea Only and Other (See Comments)    Sick on stomach    Consultations:  None   Procedures/Studies: DG Chest 2 View  Result Date: 10/04/2020 CLINICAL DATA:  Fall with left-sided shoulder pain EXAM: CHEST - 2 VIEW COMPARISON:  None. FINDINGS: Right shoulder replacement. No focal opacity or pleural effusion. Borderline to mild cardiomegaly. Upper normal mediastinal shadow. No pneumothorax. IMPRESSION: No active cardiopulmonary disease.  Borderline to mild cardiomegaly Electronically Signed   By: Donavan Foil M.D.    On: 10/04/2020 16:46   DG Lumbar Spine 2-3 Views  Result Date: 10/04/2020 CLINICAL DATA:  Fall with back pain EXAM: LUMBAR SPINE - 2-3 VIEW COMPARISON:  None. FINDINGS: Five non rib-bearing lumbar type vertebra. Lumbar alignment within normal limits. Vertebral body heights are maintained. The disc spaces appear patent. Minimal degenerative osteophytes. IMPRESSION: No acute osseous abnormality Electronically Signed   By: Donavan Foil M.D.   On: 10/04/2020 16:46   DG Shoulder Right  Result Date: 10/04/2020 CLINICAL DATA:  Fall with shoulder pain EXAM: RIGHT SHOULDER - 2+ VIEW COMPARISON:  03/02/2020 FINDINGS: Reverse right shoulder replacement with intact hardware and normal alignment. No fracture is seen. IMPRESSION: Right shoulder arthroplasty without acute osseous abnormality. Electronically Signed   By: Donavan Foil M.D.   On: 10/04/2020 16:48   CT HEAD WO CONTRAST  Result Date: 10/04/2020 CLINICAL DATA:  Triage note: Pt comes into the ED via EMS from home, states her daughter found her in the floor, pt is unable to recall if she passed out an hour ago or 12hrs ago. States she has been feeling weak and disoriented since yesterday.Pt c/o lower back pain and neck pain. States she is starting to remember falling going to the BR this morning, states she fell hitting the side of the tube, hitting her head. States she remembers her grandson getting up to go to the gym this morningPt is disoriented to time, and place EXAM: CT HEAD WITHOUT CONTRAST CT CERVICAL SPINE WITHOUT CONTRAST TECHNIQUE: Multidetector CT imaging of the head and cervical spine was performed following the standard protocol without intravenous contrast. Multiplanar CT image reconstructions of the cervical spine were also generated. COMPARISON:  None. FINDINGS: CT HEAD FINDINGS Brain: No evidence of acute infarction, hemorrhage, hydrocephalus, extra-axial collection or mass lesion/mass effect. Vascular: No hyperdense vessel or unexpected  calcification. Skull: Normal. Negative for fracture or focal lesion. Sinuses/Orbits: Visualized globes and orbits are unremarkable. The visualized sinuses are clear. Other: None. CT CERVICAL SPINE FINDINGS Alignment: Reversal of the normal cervical lordosis, apex at C6. No spondylolisthesis. Skull base and vertebrae: No acute fracture. No primary bone lesion or focal pathologic process. Soft tissues and spinal canal: No prevertebral fluid or swelling. No visible canal hematoma. Disc levels: Moderate loss of disc height endplate sclerosis and spurring and disc bulging at C5-C6 and C6-C7. Remaining cervical disc levels are well preserved. No evidence of a disc herniation. No significant stenosis. Upper chest: Negative. Other: None. IMPRESSION: HEAD CT 1. Normal. CERVICAL CT 1. No fracture or acute finding. 2. Reversed cervical lordosis. Disc degenerative changes at C5-C6 and C6-C7. Electronically Signed   By: Lajean Manes M.D.   On: 10/04/2020 15:30   CT ANGIO CHEST PE W OR WO CONTRAST  Result Date: 10/05/2020 CLINICAL DATA:  Recent syncopal episode EXAM: CT ANGIOGRAPHY CHEST WITH CONTRAST TECHNIQUE: Multidetector CT imaging of the chest was  performed using the standard protocol during bolus administration of intravenous contrast. Multiplanar CT image reconstructions and MIPs were obtained to evaluate the vascular anatomy. CONTRAST:  36mL OMNIPAQUE IOHEXOL 350 MG/ML SOLN COMPARISON:  None. FINDINGS: Cardiovascular: Thoracic aorta and its branches demonstrate atherosclerotic calcifications. No aneurysmal dilatation or dissection is noted. No cardiac enlargement is seen. The pulmonary artery shows a normal enhancement pattern and normal branching pattern. No filling defect to suggest pulmonary embolism is noted. Mediastinum/Nodes: Thoracic inlet is within normal limits. No hilar or mediastinal adenopathy is noted. The esophagus as visualized is within normal limits. Lungs/Pleura: Lungs are well aerated bilaterally.  No focal infiltrate or sizable effusion is seen. Upper Abdomen: Visualized upper abdomen shows no acute abnormality. Musculoskeletal: No acute bony abnormality is noted. Prior right shoulder replacement is seen. Review of the MIP images confirms the above findings. IMPRESSION: No evidence of pulmonary emboli. No acute abnormality seen. Aortic Atherosclerosis (ICD10-I70.0). Electronically Signed   By: Inez Catalina M.D.   On: 10/05/2020 12:26   CT Cervical Spine Wo Contrast  Result Date: 10/04/2020 CLINICAL DATA:  Triage note: Pt comes into the ED via EMS from home, states her daughter found her in the floor, pt is unable to recall if she passed out an hour ago or 12hrs ago. States she has been feeling weak and disoriented since yesterday.Pt c/o lower back pain and neck pain. States she is starting to remember falling going to the BR this morning, states she fell hitting the side of the tube, hitting her head. States she remembers her grandson getting up to go to the gym this morningPt is disoriented to time, and place EXAM: CT HEAD WITHOUT CONTRAST CT CERVICAL SPINE WITHOUT CONTRAST TECHNIQUE: Multidetector CT imaging of the head and cervical spine was performed following the standard protocol without intravenous contrast. Multiplanar CT image reconstructions of the cervical spine were also generated. COMPARISON:  None. FINDINGS: CT HEAD FINDINGS Brain: No evidence of acute infarction, hemorrhage, hydrocephalus, extra-axial collection or mass lesion/mass effect. Vascular: No hyperdense vessel or unexpected calcification. Skull: Normal. Negative for fracture or focal lesion. Sinuses/Orbits: Visualized globes and orbits are unremarkable. The visualized sinuses are clear. Other: None. CT CERVICAL SPINE FINDINGS Alignment: Reversal of the normal cervical lordosis, apex at C6. No spondylolisthesis. Skull base and vertebrae: No acute fracture. No primary bone lesion or focal pathologic process. Soft tissues and spinal  canal: No prevertebral fluid or swelling. No visible canal hematoma. Disc levels: Moderate loss of disc height endplate sclerosis and spurring and disc bulging at C5-C6 and C6-C7. Remaining cervical disc levels are well preserved. No evidence of a disc herniation. No significant stenosis. Upper chest: Negative. Other: None. IMPRESSION: HEAD CT 1. Normal. CERVICAL CT 1. No fracture or acute finding. 2. Reversed cervical lordosis. Disc degenerative changes at C5-C6 and C6-C7. Electronically Signed   By: Lajean Manes M.D.   On: 10/04/2020 15:30   MR BRAIN WO CONTRAST  Result Date: 10/04/2020 CLINICAL DATA:  Syncope EXAM: MRI HEAD WITHOUT CONTRAST TECHNIQUE: Multiplanar, multiecho pulse sequences of the brain and surrounding structures were obtained without intravenous contrast. COMPARISON:  None. FINDINGS: Brain: No acute infarct, mass effect or extra-axial collection. No acute or chronic hemorrhage. Normal white matter signal, parenchymal volume and CSF spaces. The midline structures are normal. Vascular: Major flow voids are preserved. Skull and upper cervical spine: Normal calvarium and skull base. Visualized upper cervical spine and soft tissues are normal. Sinuses/Orbits:No paranasal sinus fluid levels or advanced mucosal thickening. No mastoid or  middle ear effusion. Normal orbits. IMPRESSION: Normal brain MRI. Electronically Signed   By: Ulyses Jarred M.D.   On: 10/04/2020 21:19   DG Humerus Left  Result Date: 10/04/2020 CLINICAL DATA:  Fall with LEFT shoulder pain and low back pain. EXAM: LEFT HUMERUS - 2+ VIEW COMPARISON:  Contralateral shoulder. FINDINGS: There is no evidence of fracture or other focal bone lesions. Soft tissues are unremarkable. IMPRESSION: Negative evaluation of the LEFT humerus. Electronically Signed   By: Zetta Bills M.D.   On: 10/04/2020 16:45   DG Foot 2 Views Right  Result Date: 10/06/2020 CLINICAL DATA:  Right foot pain after fall earlier this week. EXAM: RIGHT FOOT - 2  VIEW COMPARISON:  None. FINDINGS: Suspected acute nondisplaced fracture at the radial base of the fourth proximal phalanx. Erosive changes of the medial first MTP joint with overlying soft tissue swelling. Joint spaces are preserved. Bone mineralization is normal. IMPRESSION: 1. Suspected acute nondisplaced fracture at the radial base of the fourth proximal phalanx. Correlate with point tenderness. 2. Erosive changes of the medial first MTP joint, consistent with gout. Electronically Signed   By: Titus Dubin M.D.   On: 10/06/2020 13:56   ECHOCARDIOGRAM COMPLETE  Result Date: 10/05/2020    ECHOCARDIOGRAM REPORT   Patient Name:   JAEMARIE KOVATCH Date of Exam: 10/05/2020 Medical Rec #:  [id number]          Height:       65.0 in Accession #:    [id number]         Weight:       274.0 lb Date of Birth:  21-Oct-1955          BSA:          2.261 m Patient Age:    22 years           BP:           137/61 mmHg Patient Gender: F                  HR:           84 bpm. Exam Location:  [facility] Procedure: 2D Echo, Color Doppler, Cardiac Doppler and Intracardiac            Opacification Agent Indications:     R55 Syncope  History:         Patient has no prior history of Echocardiogram examinations.                  Risk Factors:Hypertension and Diabetes.  Sonographer:     Charmayne Sheer RDCS (AE) Referring Phys:  [id number] AMY N [facility] Diagnosing Phys: Ida Rogue MD  Sonographer Comments: Technically difficult study due to poor echo windows. Image acquisition challenging due to patient body habitus. IMPRESSIONS  1. Left ventricular ejection fraction, by estimation, is 60 to 65%. The left ventricle has normal function. The left ventricle has no regional wall motion abnormalities. Left ventricular diastolic parameters are consistent with Grade I diastolic dysfunction (impaired relaxation).  2. Right ventricular systolic function is normal. The right ventricular size is normal. Tricuspid regurgitation signal is inadequate for assessing  PA pressure.  3. The mitral valve is normal in structure. No evidence of mitral valve regurgitation. No evidence of mitral stenosis. FINDINGS  Left Ventricle: Left ventricular ejection fraction, by estimation, is 60 to 65%. The left ventricle has normal function. The left ventricle has no regional wall motion abnormalities. Definity contrast agent was given IV to delineate the  left ventricular  endocardial borders. The left ventricular internal cavity size was normal in size. There is no left ventricular hypertrophy. Left ventricular diastolic parameters are consistent with Grade I diastolic dysfunction (impaired relaxation). Right Ventricle: The right ventricular size is normal. No increase in right ventricular wall thickness. Right ventricular systolic function is normal. Tricuspid regurgitation signal is inadequate for assessing PA pressure. Left Atrium: Left atrial size was normal in size. Right Atrium: Right atrial size was normal in size. Pericardium: There is no evidence of pericardial effusion. Mitral Valve: The mitral valve is normal in structure. No evidence of mitral valve regurgitation. No evidence of mitral valve stenosis. MV peak gradient, 4.6 mmHg. The mean mitral valve gradient is 2.0 mmHg. Tricuspid Valve: The tricuspid valve is normal in structure. Tricuspid valve regurgitation is not demonstrated. No evidence of tricuspid stenosis. Aortic Valve: The aortic valve is normal in structure. Aortic valve regurgitation is not visualized. No aortic stenosis is present. Aortic valve mean gradient measures 8.0 mmHg. Aortic valve peak gradient measures 11.4 mmHg. Aortic valve area, by VTI measures 3.36 cm. Pulmonic Valve: The pulmonic valve was normal in structure. Pulmonic valve regurgitation is not visualized. No evidence of pulmonic stenosis. Aorta: The aortic root is normal in size and structure. Venous: The inferior vena cava is normal in size with greater than 50% respiratory variability, suggesting  right atrial pressure of 3 mmHg. IAS/Shunts: No atrial level shunt detected by color flow Doppler.  LEFT VENTRICLE PLAX 2D LVIDd:         4.20 cm  Diastology LVIDs:         3.10 cm  LV e' medial:    9.79 cm/s LV PW:         1.30 cm  LV E/e' medial:  10.2 LV IVS:        1.10 cm  LV e' lateral:   7.07 cm/s LVOT diam:     2.40 cm  LV E/e' lateral: 14.1 LV SV:         117 LV SV Index:   52 LVOT Area:     4.52 cm  LEFT ATRIUM         Index LA diam:    3.30 cm 1.46 cm/m  AORTIC VALVE                    PULMONIC VALVE AV Area (Vmax):    3.56 cm     PV Vmax:       0.93 m/s AV Area (Vmean):   3.18 cm     PV Vmean:      69.600 cm/s AV Area (VTI):     3.36 cm     PV VTI:        0.176 m AV Vmax:           169.00 cm/s  PV Peak grad:  3.5 mmHg AV Vmean:          135.000 cm/s PV Mean grad:  2.0 mmHg AV VTI:            0.349 m AV Peak Grad:      11.4 mmHg AV Mean Grad:      8.0 mmHg LVOT Vmax:         133.00 cm/s LVOT Vmean:        94.800 cm/s LVOT VTI:          0.259 m LVOT/AV VTI ratio: 0.74  AORTA Ao Root diam: 3.20 cm MITRAL VALVE MV Area (PHT):  3.83 cm     SHUNTS MV Area VTI:   3.93 cm     Systemic VTI:  0.26 m MV Peak grad:  4.6 mmHg     Systemic Diam: 2.40 cm MV Mean grad:  2.0 mmHg MV Vmax:       1.07 m/s MV Vmean:      70.5 cm/s MV Decel Time: 198 msec MV E velocity: 99.80 cm/s MV A velocity: 102.00 cm/s MV E/A ratio:  0.98 Ida Rogue MD Electronically signed by Ida Rogue MD Signature Date/Time: 10/05/2020/5:40:52 PM    Final     (Echo, Carotid, EGD, Colonoscopy, ERCP)    Subjective: Patient seen and examined on the day of discharge.  Stable, no distress.  Stable for discharge home.  Home health services ordered.  Discharge Exam: Vitals:   10/06/20 1100 10/06/20 1231  BP:  (!) 120/55  Pulse:  98  Resp:  20  Temp:  98.4 F (36.9 C)  SpO2: 95% 94%   Vitals:   10/06/20 0900 10/06/20 1055 10/06/20 1100 10/06/20 1231  BP: 124/61 123/66  (!) 120/55  Pulse: 100 89  98  Resp: (!) 24 20  20    Temp:  98.5 F (36.9 C)  98.4 F (36.9 C)  TempSrc:  Oral  Oral  SpO2: 94% 95% 95% 94%  Weight:      Height:        General: Pt is alert, awake, not in acute distress Cardiovascular: RRR, S1/S2 +, no rubs, no gallops Respiratory: CTA bilaterally, no wheezing, no rhonchi Abdominal: Soft, NT, ND, bowel sounds + Extremities: no edema, no cyanosis    The results of significant diagnostics from this hospitalization (including imaging, microbiology, ancillary and laboratory) are listed below for reference.     Microbiology: Recent Results (from the past 240 hour(s))  Resp Panel by RT-PCR (Flu A&B, Covid) Nasopharyngeal Swab     Status: None   Collection Time: 10/04/20  8:28 PM   Specimen: Nasopharyngeal Swab; Nasopharyngeal(NP) swabs in vial transport medium  Result Value Ref Range Status   SARS Coronavirus 2 by RT PCR NEGATIVE NEGATIVE Final    Comment: (NOTE) SARS-CoV-2 target nucleic acids are NOT DETECTED.  The SARS-CoV-2 RNA is generally detectable in upper respiratory specimens during the acute phase of infection. The lowest concentration of SARS-CoV-2 viral copies this assay can detect is 138 copies/mL. A negative result does not preclude SARS-Cov-2 infection and should not be used as the sole basis for treatment or other patient management decisions. A negative result may occur with  improper specimen collection/handling, submission of specimen other than nasopharyngeal swab, presence of viral mutation(s) within the areas targeted by this assay, and inadequate number of viral copies(<138 copies/mL). A negative result must be combined with clinical observations, patient history, and epidemiological information. The expected result is Negative.  Fact Sheet for Patients:  EntrepreneurPulse.com.au  Fact Sheet for Healthcare Providers:  IncredibleEmployment.be  This test is no t yet approved or cleared by the [place] FDA and   has been authorized for detection and/or diagnosis of SARS-CoV-2 by FDA under an Emergency Use Authorization (EUA). This EUA will remain  in effect (meaning this test can be used) for the duration of the COVID-19 declaration under Section 564(b)(1) of the Act, 21 U.S.C.section 360bbb-3(b)(1), unless the authorization is terminated  or revoked sooner.       Influenza A by PCR NEGATIVE NEGATIVE Final   Influenza B by PCR NEGATIVE NEGATIVE Final    Comment: (NOTE)  The Xpert Xpress SARS-CoV-2/FLU/RSV plus assay is intended as an aid in the diagnosis of influenza from Nasopharyngeal swab specimens and should not be used as a sole basis for treatment. Nasal washings and aspirates are unacceptable for Xpert Xpress SARS-CoV-2/FLU/RSV testing.  Fact Sheet for Patients: EntrepreneurPulse.com.au  Fact Sheet for Healthcare Providers: IncredibleEmployment.be  This test is not yet approved or cleared by the [place] FDA and has been authorized for detection and/or diagnosis of SARS-CoV-2 by FDA under an Emergency Use Authorization (EUA). This EUA will remain in effect (meaning this test can be used) for the duration of the COVID-19 declaration under Section 564(b)(1) of the Act, 21 U.S.C. section 360bbb-3(b)(1), unless the authorization is terminated or revoked.  Performed at [facility], [address] [place], [place] [address]   MRSA PCR Screening     Status: None   Collection Time: 10/04/20 11:00 PM   Specimen: Nasal Mucosa; Nasopharyngeal  Result Value Ref Range Status   MRSA by PCR NEGATIVE NEGATIVE Final    Comment:        The GeneXpert MRSA Assay (FDA approved for NASAL specimens only), is one component of a comprehensive MRSA colonization surveillance program. It is not intended to diagnose MRSA infection nor to guide or monitor treatment for MRSA infections. Performed at [facility], [address]  [place],  [address]      Labs: BNP (last 3 results) Recent Labs    10/04/20 2258  BNP [id number]   Basic Metabolic Panel: Recent Labs  Lab 10/04/20 1359 10/05/20 0451  NA 136 139  K 5.0 4.2  CL 98 101  CO2 27 30  GLUCOSE 156* 165*  BUN 43* 40*  CREATININE 1.81* 1.52*  CALCIUM 9.5 8.7*   Liver Function Tests: Recent Labs  Lab 10/04/20 1359  AST 29  ALT 17  ALKPHOS 147*  BILITOT 0.7  PROT 7.9  ALBUMIN 3.9   No results for input(s): LIPASE, AMYLASE in the last 168 hours. Recent Labs  Lab 10/04/20 1742  AMMONIA 17   CBC: Recent Labs  Lab 10/04/20 1359 10/05/20 0451  WBC 10.9* 8.7  NEUTROABS 8.6*  --   HGB 11.3* 9.9*  HCT 36.3 32.0*  MCV 88.3 89.1  PLT 287 226   Cardiac Enzymes: Recent Labs  Lab 10/04/20 1359  CKTOTAL 94   BNP: Invalid input(s): POCBNP CBG: Recent Labs  Lab 10/05/20 1118 10/05/20 1624 10/05/20 2121 10/06/20 0741 10/06/20 1227  GLUCAP 187* 77 120* 149* 237*   D-Dimer Recent Labs    10/04/20 2258  DDIMER 2.08*   Hgb A1c Recent Labs    10/04/20 2258  HGBA1C 8.5*   Lipid Profile No results for input(s): CHOL, HDL, LDLCALC, TRIG, CHOLHDL, LDLDIRECT in the last 72 hours. Thyroid function studies Recent Labs    10/04/20 2258  TSH 1.280   Anemia work up No results for input(s): VITAMINB12, FOLATE, FERRITIN, TIBC, IRON, RETICCTPCT in the last 72 hours. Urinalysis    Component Value Date/Time   COLORURINE YELLOW (A) 10/05/2020 0100   APPEARANCEUR CLEAR (A) 10/05/2020 0100   LABSPEC 1.012 10/05/2020 0100   PHURINE 5.0 10/05/2020 0100   GLUCOSEU NEGATIVE 10/05/2020 0100   HGBUR NEGATIVE 10/05/2020 0100   BILIRUBINUR NEGATIVE 10/05/2020 0100   KETONESUR NEGATIVE 10/05/2020 0100   PROTEINUR NEGATIVE 10/05/2020 0100   NITRITE NEGATIVE 10/05/2020 0100   LEUKOCYTESUR NEGATIVE 10/05/2020 0100   Sepsis Labs Invalid input(s): PROCALCITONIN,  WBC,  LACTICIDVEN Microbiology Recent Results (from the past  240 hour(s))   Resp Panel by RT-PCR (Flu A&B, Covid) Nasopharyngeal Swab     Status: None   Collection Time: 10/04/20  8:28 PM   Specimen: Nasopharyngeal Swab; Nasopharyngeal(NP) swabs in vial transport medium  Result Value Ref Range Status   SARS Coronavirus 2 by RT PCR NEGATIVE NEGATIVE Final    Comment: (NOTE) SARS-CoV-2 target nucleic acids are NOT DETECTED.  The SARS-CoV-2 RNA is generally detectable in upper respiratory specimens during the acute phase of infection. The lowest concentration of SARS-CoV-2 viral copies this assay can detect is 138 copies/mL. A negative result does not preclude SARS-Cov-2 infection and should not be used as the sole basis for treatment or other patient management decisions. A negative result may occur with  improper specimen collection/handling, submission of specimen other than nasopharyngeal swab, presence of viral mutation(s) within the areas targeted by this assay, and inadequate number of viral copies(<138 copies/mL). A negative result must be combined with clinical observations, patient history, and epidemiological information. The expected result is Negative.  Fact Sheet for Patients:  EntrepreneurPulse.com.au  Fact Sheet for Healthcare Providers:  IncredibleEmployment.be  This test is no t yet approved or cleared by the [place] FDA and  has been authorized for detection and/or diagnosis of SARS-CoV-2 by FDA under an Emergency Use Authorization (EUA). This EUA will remain  in effect (meaning this test can be used) for the duration of the COVID-19 declaration under Section 564(b)(1) of the Act, 21 U.S.C.section 360bbb-3(b)(1), unless the authorization is terminated  or revoked sooner.       Influenza A by PCR NEGATIVE NEGATIVE Final   Influenza B by PCR NEGATIVE NEGATIVE Final    Comment: (NOTE) The Xpert Xpress SARS-CoV-2/FLU/RSV plus assay is intended as an aid in the diagnosis of influenza from  Nasopharyngeal swab specimens and should not be used as a sole basis for treatment. Nasal washings and aspirates are unacceptable for Xpert Xpress SARS-CoV-2/FLU/RSV testing.  Fact Sheet for Patients: EntrepreneurPulse.com.au  Fact Sheet for Healthcare Providers: IncredibleEmployment.be  This test is not yet approved or cleared by the [place] FDA and has been authorized for detection and/or diagnosis of SARS-CoV-2 by FDA under an Emergency Use Authorization (EUA). This EUA will remain in effect (meaning this test can be used) for the duration of the COVID-19 declaration under Section 564(b)(1) of the Act, 21 U.S.C. section 360bbb-3(b)(1), unless the authorization is terminated or revoked.  Performed at [facility], [address] [place], [place] [address]   MRSA PCR Screening     Status: None   Collection Time: 10/04/20 11:00 PM   Specimen: Nasal Mucosa; Nasopharyngeal  Result Value Ref Range Status   MRSA by PCR NEGATIVE NEGATIVE Final    Comment:        The GeneXpert MRSA Assay (FDA approved for NASAL specimens only), is one component of a comprehensive MRSA colonization surveillance program. It is not intended to diagnose MRSA infection nor to guide or monitor treatment for MRSA infections. Performed at [facility], [address], [place], [place] [address]      Time coordinating discharge: Over 30 minutes  SIGNED:   Sidney Ace, MD  [facility] 10/06/2020, 3:19 PM Pager   If 7PM-7AM, please contact night-coverage

## 2020-10-06 NOTE — Care Management Obs Status (Signed)
[place] NOTIFICATION   Patient Details  Name: Felicia Lane MRN: [id number] Date of Birth: August 15, 1955   Medicare Observation Status Notification Given:  Yes    Ova Freshwater 10/06/2020, 5:32 PM

## 2020-10-06 NOTE — Care Management CC44 (Signed)
Condition Code 44 Documentation Completed  Patient Details  Name: LINDORA ALVIAR MRN: [id number] Date of Birth: 07-14-1955   Condition Code 44 given:  Yes Patient signature on Condition Code 44 notice:  Yes Documentation of 2 MD's agreement:  Yes Code 44 added to claim:  Yes    Adelene Amas, [place] 10/06/2020, 5:32 PM

## 2020-10-06 NOTE — Progress Notes (Signed)
0840: Transfer out report given to [facility]   [phone]: Due meds given and CHG bath given. Family member at bedside.

## 2020-10-06 NOTE — Discharge Instructions (Signed)
Sleep Apnea Sleep apnea is a condition in which breathing pauses or becomes shallow during sleep. Episodes of sleep apnea usually last 10 seconds or longer, and they may occur as many as 20 times an hour. Sleep apnea disrupts your sleep and keeps your body from getting the rest that it needs. This condition can increase your risk of certain health problems, including:  Heart attack.  Stroke.  Obesity.  Diabetes.  Heart failure.  Irregular heartbeat. What are the causes? There are three kinds of sleep apnea:  Obstructive sleep apnea. This kind is caused by a blocked or collapsed airway.  Central sleep apnea. This kind happens when the part of the brain that controls breathing does not send the correct signals to the muscles that control breathing.  Mixed sleep apnea. This is a combination of obstructive and central sleep apnea. The most common cause of this condition is a collapsed or blocked airway. An airway can collapse or become blocked if:  Your throat muscles are abnormally relaxed.  Your tongue and tonsils are larger than normal.  You are overweight.  Your airway is smaller than normal.   What increases the risk? You are more likely to develop this condition if you:  Are overweight.  Smoke.  Have a smaller than normal airway.  Are elderly.  Are female.  Drink alcohol.  Take sedatives or tranquilizers.  Have a family history of sleep apnea. What are the signs or symptoms? Symptoms of this condition include:  Trouble staying asleep.  Daytime sleepiness and tiredness.  Irritability.  Loud snoring.  Morning headaches.  Trouble concentrating.  Forgetfulness.  Decreased interest in sex.  Unexplained sleepiness.  Mood swings.  Personality changes.  Feelings of depression.  Waking up often during the night to urinate.  Dry mouth.  Sore throat. How is this diagnosed? This condition may be diagnosed with:  A medical history.  A physical  exam.  A series of tests that are done while you are sleeping (sleep study). These tests are usually done in a sleep lab, but they may also be done at home. How is this treated? Treatment for this condition aims to restore normal breathing and to ease symptoms during sleep. It may involve managing health issues that can affect breathing, such as high blood pressure or obesity. Treatment may include:  Sleeping on your side.  Using a decongestant if you have nasal congestion.  Avoiding the use of depressants, including alcohol, sedatives, and narcotics.  Losing weight if you are overweight.  Making changes to your diet.  Quitting smoking.  Using a device to open your airway while you sleep, such as: ? An oral appliance. This is a custom-made mouthpiece that shifts your lower jaw forward. ? A continuous positive airway pressure (CPAP) device. This device blows air through a mask when you breathe out (exhale). ? A nasal expiratory positive airway pressure (EPAP) device. This device has valves that you put into each nostril. ? A bi-level positive airway pressure (BPAP) device. This device blows air through a mask when you breathe in (inhale) and breathe out (exhale).  Having surgery if other treatments do not work. During surgery, excess tissue is removed to create a wider airway. It is important to get treatment for sleep apnea. Without treatment, this condition can lead to:  High blood pressure.  Coronary artery disease.  In men, an inability to achieve or maintain an erection (impotence).  Reduced thinking abilities.   Follow these instructions at home: Lifestyle    Make any lifestyle changes that your health care provider recommends.  Eat a healthy, well-balanced diet.  Take steps to lose weight if you are overweight.  Avoid using depressants, including alcohol, sedatives, and narcotics.  Do not use any products that contain nicotine or tobacco, such as cigarettes,  e-cigarettes, and chewing tobacco. If you need help quitting, ask your health care provider. General instructions  Take over-the-counter and prescription medicines only as told by your health care provider.  If you were given a device to open your airway while you sleep, use it only as told by your health care provider.  If you are having surgery, make sure to tell your health care provider you have sleep apnea. You may need to bring your device with you.  Keep all follow-up visits as told by your health care provider. This is important. Contact a health care provider if:  The device that you received to open your airway during sleep is uncomfortable or does not seem to be working.  Your symptoms do not improve.  Your symptoms get worse. Get help right away if:  You develop: ? Chest pain. ? Shortness of breath. ? Discomfort in your back, arms, or stomach.  You have: ? Trouble speaking. ? Weakness on one side of your body. ? Drooping in your face. These symptoms may represent a serious problem that is an emergency. Do not wait to see if the symptoms will go away. Get medical help right away. Call your local emergency services (911 in the [place].S.). Do not drive yourself to the hospital. Summary  Sleep apnea is a condition in which breathing pauses or becomes shallow during sleep.  The most common cause is a collapsed or blocked airway.  The goal of treatment is to restore normal breathing and to ease symptoms during sleep. This information is not intended to replace advice given to you by your health care provider. Make sure you discuss any questions you have with your health care provider. Document Revised: 11/04/2018 Document Reviewed: 01/13/2018 Elsevier Patient Education  2021 Elsevier Inc.  

## 2020-10-06 NOTE — TOC Initial Note (Addendum)
Transition of Care [facility]) - Initial/Assessment Note    Patient Details  Name: Felicia Lane MRN: [id number] Date of Birth: 02-25-1956  [facility] CM/SW Contact:    Beverly Sessions, RN Phone Number: 10/06/2020, 3:21 PM  Clinical Narrative:                 Patient admitted from home with syncope Patient lives at home with grandson At discharge patient will be going to her daughters home [address], [place] [address]   PCP [facility] Denies issues obtaining medication  Patient has a RW in the home  PT has evaluated patient and recommends home health PT.   Patient and daughter agreeable.  Their preference is [facility].  Referral made and accepted by [place] with [facility].  Patient and daughter declined any other disciplines at this time.  [place] provided with demographics of where patient will be staying   Daughter to transport at discharge   Expected Discharge Plan: [place] Barriers to Discharge: No Barriers Identified   Patient Goals and CMS Choice        Expected Discharge Plan and Services Expected Discharge Plan: [place]       Living arrangements for the past 2 months: Single Family Home Expected Discharge Date: 10/06/20                         HH Arranged: PT HH Agency: [facility] [employer] Date [place]: 10/06/20   Representative spoke with at [place]: [place]  Prior Living Arrangements/Services Living arrangements for the past 2 months: [place] with:: Adult Children,Relatives Patient language and need for interpreter reviewed:: Yes Do you feel safe going back to the place where you live?: Yes      Need for Family Participation in Patient Care: Yes (Comment) Care giver support system in place?: Yes (comment) Current home services: DME Criminal Activity/Legal Involvement Pertinent to  Current Situation/Hospitalization: No - Comment as needed  Activities of Daily Living Home Assistive Devices/Equipment: None ADL Screening (condition at time of admission) Patient's cognitive ability adequate to safely complete daily activities?: Yes Is the patient deaf or have difficulty hearing?: No Does the patient have difficulty seeing, even when wearing glasses/contacts?: No Does the patient have difficulty concentrating, remembering, or making decisions?: No Patient able to express need for assistance with ADLs?: Yes Does the patient have difficulty dressing or bathing?: No Independently performs ADLs?: Yes (appropriate for developmental age) Does the patient have difficulty walking or climbing stairs?: Yes Weakness of Legs: Both Weakness of Arms/Hands: None  Permission Sought/Granted                  Emotional Assessment Appearance:: Appears stated age     Orientation: : Oriented to Place,Oriented to Self,Oriented to  Time,Oriented to Situation      Admission diagnosis:  Syncope and collapse [R55] Syncope [R55] Acute hypercapnic respiratory failure ([place]) [J96.02] Patient Active Problem List   Diagnosis Date Noted  . Syncope 10/04/2020  . At risk for obstructive sleep apnea 10/04/2020  . Status post reverse total shoulder replacement, right 03/03/2020  . Primary osteoarthritis of right shoulder 12/31/2019  . Hypertension, essential 01/27/2019  . Obesity, Class III, BMI 40-49.9 (morbid obesity) ([place]) 01/27/2019  . Type 2 diabetes mellitus without complication, without long-term current use of insulin ([place]) 01/27/2019   PCP:  [name], [place] Pharmacy:   [id number] [place], [place] - [address] [place] [address] Phone: [phone] Fax: [phone]  [id number] [place], [place] [address] [place] [address] Phone: [phone] Fax: [phone]  [id number] [place], [place] [address] [place] [address] Phone: [phone] Fax: [phone]     Social Determinants of Health (SDOH) Interventions    Readmission Risk Interventions No flowsheet data found.

## 2020-10-06 NOTE — Progress Notes (Signed)
OT Cancellation Note  Patient Details Name: CLEO SANTUCCI MRN: [id number] DOB: Oct 31, 1955   Cancelled Treatment:    Reason Eval/Treat Not Completed: Other (comment). Consult received, chart reviewed. Upon attempt, pt not in room. Will re-attempt OT evaluation at later date/time as pt is available and appropriate.   Hanley Hays, MPH, MS, OTR/L ascom [phone] 10/06/20, 10:05 AM

## 2020-10-06 NOTE — Evaluation (Signed)
Physical Therapy Evaluation Patient Details Name: Felicia Lane MRN: [id number] DOB: 09-07-1955 Today's Date: 10/06/2020   History of Present Illness  Per MD notes: Pt is a 65 y.o. female with medical history significant for obesity, depression, hypertension, non-insulin-dependent diabetes mellitus, at risk for obstructive sleep apnea, depression, hyperlipidemia, presents to the emergency department for chief concerns of syncope.  Pt with multiple episodes of LOC followed by falls.  MD assessment includes: Multiple syncopal events, HTN, and CKD 3B versus acute kidney injury.    Clinical Impression  Pt was pleasant and motivated to participate during the session.  Pt's orthostatic BP's taken as follows: supine 123/66, sitting 135/74, standing 122/78 all without adverse symptoms.  Pt required min A with bed mobility tasks but otherwise required no physical assistance during the session.  Pt was steady with transfers with good eccentric and concentric control.  Pt was able to ambulate with slow but steady cadence both with a RW as well as with no AD although pt preferred no AD.  Pt was able to ascend and descend steps with one rail again with slow, cautious cadence but steady with good control.  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  Pt will benefit from HHPT upon discharge to safely address deficits listed in patient problem list for decreased caregiver assistance and eventual return to PLOF.      Follow Up Recommendations Home health PT;Supervision for mobility/OOB    Equipment Recommendations  None recommended by PT    Recommendations for Other Services       Precautions / Restrictions Precautions Precautions: Fall Restrictions Weight Bearing Restrictions: No Other Position/Activity Restrictions: Watch for possible orthostatic hypotension      Mobility  Bed Mobility Overal bed  mobility: Needs Assistance Bed Mobility: Supine to Sit     Supine to sit: Min assist     General bed mobility comments: Min A for trunk to full upright position    Transfers Overall transfer level: Needs assistance Equipment used: Rolling walker (2 wheeled);None Transfers: Sit to/from Stand Sit to Stand: Min guard         General transfer comment: Pt steady with good eccentric and concentric control and stability both with a RW and without an AD  Ambulation/Gait   Gait Distance (Feet): 40 Feet Assistive device: Rolling walker (2 wheeled);None Gait Pattern/deviations: Step-through pattern;Decreased step length - right;Decreased step length - left Gait velocity: decreased   General Gait Details: Slow cadence with short B step length but steady without LOB with both a RW and without an AD  Stairs Stairs: Yes Stairs assistance: Min guard Stair Management: One rail Left;Step to pattern;Forwards Number of Stairs: 3 General stair comments: Pt able to ascend and descend 3 steps with one rail with step-to pattern with good control and stability  Wheelchair Mobility    Modified Rankin (Stroke Patients Only)       Balance Overall balance assessment: Needs assistance   Sitting balance-Leahy Scale: Normal     Standing balance support: No upper extremity supported Standing balance-Leahy Scale: Good Standing balance comment: Slow, cautious cadence during amb without AD but steady without LOB                             Pertinent Vitals/Pain Pain Assessment: 0-10 Pain Score: 5  Pain Location: L jaw only when turning head to the side,  R great toe, L knee Pain Descriptors / Indicators: Sore Pain Intervention(s): Monitored during session;Premedicated before session    Home Living Family/patient expects to be discharged to:: Private residence Living Arrangements: Children;Other relatives Available Help at Discharge: Family;Available 24 hours/day Type of Home:  House Home Access: Stairs to enter Entrance Stairs-Rails: Right;Left;Can reach both Entrance Stairs-Number of Steps: 3 Home Layout: One level Home Equipment: Walker - 2 wheels      Prior Function Level of Independence: Independent         Comments: Ind amb community distances without an AD, no other fall history other than recent falls secondary to syncope, Ind with ADLs, water aerobics at [employer] 2x/wk     Hand Dominance        Extremity/Trunk Assessment   Upper Extremity Assessment Upper Extremity Assessment: RUE deficits/detail RUE Deficits / Details: Recent R TSA per patient    Lower Extremity Assessment Lower Extremity Assessment: Generalized weakness       Communication   Communication: No difficulties  Cognition Arousal/Alertness: Awake/alert Behavior During Therapy: WFL for tasks assessed/performed Overall Cognitive Status: Within Functional Limits for tasks assessed                                        General Comments      Exercises Total Joint Exercises Ankle Circles/Pumps: AROM;Strengthening;Both;10 reps Quad Sets: Strengthening;Both;10 reps Gluteal Sets: Strengthening;Both;10 reps Heel Slides: AROM;Strengthening;Both;5 reps Long Arc Quad: AROM;Strengthening;Both;10 reps Knee Flexion: AROM;Strengthening;Both;10 reps Other Exercises Other Exercises: HEP education for BLE APs, QS, GS, and LAQs   Assessment/Plan    PT Assessment Patient needs continued PT services  PT Problem List Decreased strength;Decreased activity tolerance;Decreased balance;Decreased mobility       PT Treatment Interventions DME instruction;Gait training;Stair training;Functional mobility training;Therapeutic activities;Therapeutic exercise;Balance training;Patient/family education    PT Goals (Current goals can be found in the Care Plan section)  Acute Rehab PT Goals Patient Stated Goal: To return home PT Goal Formulation: With patient Time For Goal  Achievement: 10/19/20 Potential to Achieve Goals: Good    Frequency Min 2X/week   Barriers to discharge        Co-evaluation               AM-PAC PT "6 Clicks" Mobility  Outcome Measure Help needed turning from your back to your side while in a flat bed without using bedrails?: A Little Help needed moving from lying on your back to sitting on the side of a flat bed without using bedrails?: A Little Help needed moving to and from a bed to a chair (including a wheelchair)?: A Little Help needed standing up from a chair using your arms (e.g., wheelchair or bedside chair)?: A Little Help needed to walk in hospital room?: A Little Help needed climbing 3-5 steps with a railing? : A Little 6 Click Score: 18    End of Session Equipment Utilized During Treatment: Gait belt Activity Tolerance: Patient tolerated treatment well Patient left: Other (comment) (Pt left in BR with dtr present with education to call nsg when ready) Nurse Communication: Mobility status PT Visit Diagnosis: Difficulty in walking, not elsewhere classified (R26.2);Muscle weakness (generalized) (M62.81)    Time: 1050-1130 PT Time Calculation (min) (ACUTE ONLY): 40 min   Charges:   PT Evaluation $PT Eval Moderate Complexity: 1 Mod PT Treatments $Therapeutic Exercise: 8-22 mins $Therapeutic Activity: 8-22 mins  Linus Salmons PT, DPT 10/06/20, 1:45 PM

## 2020-10-10 LAB — BLOOD GAS, VENOUS
Acid-Base Excess: 1.7 mmol/L (ref 0.0–2.0)
Acid-Base Excess: 5.9 mmol/L — ABNORMAL HIGH (ref 0.0–2.0)
Bicarbonate: 32.8 mmol/L — ABNORMAL HIGH (ref 20.0–28.0)
Bicarbonate: 33.2 mmol/L — ABNORMAL HIGH (ref 20.0–28.0)
O2 Saturation: 32.6 %
O2 Saturation: 52 %
Patient temperature: 37
Patient temperature: 37
pCO2, Ven: 73 mmHg (ref 44.0–60.0)
pCO2, Ven: 63 mmHg — ABNORMAL HIGH (ref 44.0–60.0)
pH, Ven: 7.26 (ref 7.250–7.430)
pH, Ven: 7.33 (ref 7.250–7.430)

## 2020-11-06 ENCOUNTER — Other Ambulatory Visit
Admission: RE | Admit: 2020-11-06 | Discharge: 2020-11-06 | Disposition: A | Discharge status: Home / Self Care | Payer: Medicare HMO | Source: Ambulatory Visit | Attending: Pulmonary Disease | Admitting: Pulmonary Disease

## 2020-11-06 ENCOUNTER — Other Ambulatory Visit: Payer: Self-pay

## 2020-11-06 DIAGNOSIS — Z20822 Contact with and (suspected) exposure to covid-19: Secondary | ICD-10-CM | POA: Insufficient documentation

## 2020-11-06 DIAGNOSIS — Z01812 Encounter for preprocedural laboratory examination: Secondary | ICD-10-CM | POA: Insufficient documentation

## 2020-11-07 LAB — SARS CORONAVIRUS 2 (TAT 6-24 HRS): SARS Coronavirus 2: NEGATIVE

## 2020-11-08 ENCOUNTER — Ambulatory Visit: Payer: Medicare HMO | Attending: Internal Medicine

## 2020-11-08 DIAGNOSIS — G4733 Obstructive sleep apnea (adult) (pediatric): Secondary | ICD-10-CM | POA: Diagnosis not present

## 2020-11-08 DIAGNOSIS — G4761 Periodic limb movement disorder: Secondary | ICD-10-CM | POA: Diagnosis not present

## 2020-11-09 ENCOUNTER — Other Ambulatory Visit: Payer: Self-pay

## 2021-01-03 ENCOUNTER — Other Ambulatory Visit: Payer: Self-pay | Admitting: Family Medicine

## 2021-01-03 DIAGNOSIS — Z1231 Encounter for screening mammogram for malignant neoplasm of breast: Secondary | ICD-10-CM

## 2021-01-29 ENCOUNTER — Other Ambulatory Visit: Payer: Self-pay

## 2021-01-29 ENCOUNTER — Ambulatory Visit
Admission: RE | Admit: 2021-01-29 | Discharge: 2021-01-29 | Disposition: A | Discharge status: Home / Self Care | Payer: Medicare HMO | Source: Ambulatory Visit | Attending: Family Medicine | Admitting: Family Medicine

## 2021-01-29 DIAGNOSIS — Z1231 Encounter for screening mammogram for malignant neoplasm of breast: Secondary | ICD-10-CM

## 2021-03-30 ENCOUNTER — Other Ambulatory Visit: Payer: Self-pay

## 2021-03-30 DIAGNOSIS — Z1211 Encounter for screening for malignant neoplasm of colon: Secondary | ICD-10-CM

## 2021-03-30 MED ORDER — PEG 3350-KCL-NA BICARB-NACL 420 G PO SOLR: 4000.0000 mL | Freq: Once | ORAL | 0 refills | Status: AC

## 2021-03-30 NOTE — Progress Notes (Signed)
Gastroenterology Pre-Procedure Review  Request Date: 05/02/21 Requesting Physician: Dr. Bonna Gains  PATIENT REVIEW QUESTIONS: The patient responded to the following health history questions as indicated:    1. Are you having any GI issues? no 2. Do you have a personal history of Polyps? yes (last one maybe 1997, polyps removed.) 3. Do you have a family history of Colon Cancer or Polyps? no 4. Diabetes Mellitus? no 5. Joint replacements in the past 12 months?no 6. Major health problems in the past 3 months?no 7. Any artificial heart valves, MVP, or defibrillator?no    MEDICATIONS & ALLERGIES:    Patient reports the following regarding taking any anticoagulation/antiplatelet therapy:   Plavix, Coumadin, Eliquis, Xarelto, Lovenox, Pradaxa, Brilinta, or Effient? no Aspirin? no  Patient confirms/reports the following medications:  Current Outpatient Medications  Medication Sig Dispense Refill   acetaminophen (TYLENOL) 500 MG tablet Take 500-1,000 mg by mouth every 6 (six) hours as needed (for pain.).     allopurinol (ZYLOPRIM) 100 MG tablet Take 100 mg by mouth at bedtime.     atorvastatin (LIPITOR) 40 MG tablet Take 40 mg by mouth every evening.     baclofen (LIORESAL) 10 MG tablet Take 10 mg by mouth 3 (three) times daily.     cetirizine (ZYRTEC) 10 MG tablet Take 10 mg by mouth daily.     Cholecalciferol (VITAMIN D3 PO) Take 1 tablet by mouth daily.     Cyanocobalamin (VITAMIN B-12 PO) Take 1 tablet by mouth daily.     DULoxetine (CYMBALTA) 30 MG capsule Take 60 mg by mouth at bedtime.     erythromycin ophthalmic ointment erythromycin 5 mg/gram (0.5 %) eye ointment     furosemide (LASIX) 20 MG tablet Take 20 mg by mouth daily. In the morning.     gabapentin (NEURONTIN) 600 MG tablet Take 600 mg by mouth every 8 (eight) hours.     glipiZIDE (GLUCOTROL) 10 MG tablet Take 10 mg by mouth 2 (two) times daily.     Glucosamine-Chondroitin (COSAMIN DS PO) Take 1 tablet by mouth at bedtime.      hydrochlorothiazide (HYDRODIURIL) 25 MG tablet Take 25 mg by mouth at bedtime.     lisinopril (ZESTRIL) 10 MG tablet Take 10 mg by mouth at bedtime.     meloxicam (MOBIC) 15 MG tablet Take 15 mg by mouth every evening.     metFORMIN (GLUCOPHAGE-XR) 500 MG 24 hr tablet Take 500 mg by mouth 2 (two) times daily.     Multiple Vitamin (MULTIVITAMIN WITH MINERALS) TABS tablet Take 1 tablet by mouth at bedtime.     naproxen (NAPROSYN) 500 MG tablet Take 500 mg by mouth 2 (two) times daily.     Omega-3 Fatty Acids (FISH OIL) 1200 MG CAPS Take 1,200 mg by mouth at bedtime.     ondansetron (ZOFRAN-ODT) 4 MG disintegrating tablet Take 4 mg by mouth every 8 (eight) hours as needed.     OZEMPIC, 0.25 OR 0.5 MG/DOSE, 2 MG/1.5ML SOPN Inject into the skin.     sitaGLIPtin (JANUVIA) 100 MG tablet Take 100 mg by mouth at bedtime.     No current facility-administered medications for this visit.    Patient confirms/reports the following allergies:  Allergies  Allergen Reactions   Codeine Itching    Impairs renal function (itching in throat)   Shellfish Allergy Nausea Only and Other (See Comments)    Sick on stomach    No orders of the defined types were placed in this encounter.  AUTHORIZATION INFORMATION Primary Insurance: 1D#: Group #:  Secondary Insurance: 1D#: Group #:  SCHEDULE INFORMATION: Date: 05/02/21 Time: Location: [place]

## 2021-05-02 ENCOUNTER — Ambulatory Visit: Payer: Medicare HMO | Admitting: Certified Registered"

## 2021-05-02 ENCOUNTER — Ambulatory Visit
Admission: RE | Admit: 2021-05-02 | Discharge: 2021-05-02 | Disposition: A | Discharge status: Home / Self Care | Payer: Medicare HMO | Attending: Gastroenterology | Admitting: Gastroenterology

## 2021-05-02 ENCOUNTER — Encounter
Admission: RE | Disposition: A | Discharge status: Home / Self Care | Payer: Self-pay | Source: Home / Self Care | Attending: Gastroenterology

## 2021-05-02 DIAGNOSIS — Z1211 Encounter for screening for malignant neoplasm of colon: Secondary | ICD-10-CM

## 2021-05-02 DIAGNOSIS — M1990 Unspecified osteoarthritis, unspecified site: Secondary | ICD-10-CM | POA: Diagnosis not present

## 2021-05-02 DIAGNOSIS — Z96611 Presence of right artificial shoulder joint: Secondary | ICD-10-CM | POA: Diagnosis not present

## 2021-05-02 DIAGNOSIS — K648 Other hemorrhoids: Secondary | ICD-10-CM | POA: Diagnosis not present

## 2021-05-02 DIAGNOSIS — D649 Anemia, unspecified: Secondary | ICD-10-CM | POA: Insufficient documentation

## 2021-05-02 DIAGNOSIS — Z85828 Personal history of other malignant neoplasm of skin: Secondary | ICD-10-CM | POA: Insufficient documentation

## 2021-05-02 DIAGNOSIS — K635 Polyp of colon: Secondary | ICD-10-CM

## 2021-05-02 DIAGNOSIS — E119 Type 2 diabetes mellitus without complications: Secondary | ICD-10-CM | POA: Insufficient documentation

## 2021-05-02 DIAGNOSIS — I10 Essential (primary) hypertension: Secondary | ICD-10-CM | POA: Insufficient documentation

## 2021-05-02 DIAGNOSIS — F419 Anxiety disorder, unspecified: Secondary | ICD-10-CM | POA: Diagnosis not present

## 2021-05-02 DIAGNOSIS — D123 Benign neoplasm of transverse colon: Secondary | ICD-10-CM | POA: Insufficient documentation

## 2021-05-02 HISTORY — PX: COLONOSCOPY WITH PROPOFOL: SHX5780

## 2021-05-02 LAB — GLUCOSE, CAPILLARY: Glucose-Capillary: 126 mg/dL — ABNORMAL HIGH (ref 70–99)

## 2021-05-02 SURGERY — COLONOSCOPY WITH PROPOFOL
Anesthesia: General

## 2021-05-02 MED ORDER — PROPOFOL 10 MG/ML IV BOLUS
INTRAVENOUS | Status: DC | PRN
  Administered 2021-05-02: 70 mg via INTRAVENOUS
  Administered 2021-05-02 (×2): 30 mg via INTRAVENOUS

## 2021-05-02 MED ORDER — PROPOFOL 500 MG/50ML IV EMUL
INTRAVENOUS | Status: DC | PRN
  Administered 2021-05-02: 120 ug/kg/min via INTRAVENOUS

## 2021-05-02 MED ORDER — SODIUM CHLORIDE 0.9 % IV SOLN
INTRAVENOUS | Status: DC
  Administered 2021-05-02: 20 mL/h via INTRAVENOUS

## 2021-05-02 MED ORDER — LIDOCAINE 2% (20 MG/ML) 5 ML SYRINGE
INTRAMUSCULAR | Status: DC | PRN
  Administered 2021-05-02: 20 mg via INTRAVENOUS

## 2021-05-02 NOTE — Anesthesia Preprocedure Evaluation (Signed)
Anesthesia Evaluation  Patient identified by MRN, date of birth, ID band Patient awake    Reviewed: Allergy & Precautions, NPO status , Patient's Chart, lab work & pertinent test results  History of Anesthesia Complications (+) PONV  Airway Mallampati: III  TM Distance: >3 FB Neck ROM: full    Dental  (+) Teeth Intact   Pulmonary neg pulmonary ROS,    Pulmonary exam normal  + decreased breath sounds      Cardiovascular Exercise Tolerance: Good hypertension, Pt. on medications negative cardio ROS Normal cardiovascular exam Rhythm:Regular Rate:Normal     Neuro/Psych Anxiety negative neurological ROS  negative psych ROS   GI/Hepatic negative GI ROS, Neg liver ROS,   Endo/Other  negative endocrine ROSdiabetes, Type 2  Renal/GU negative Renal ROS  negative genitourinary   Musculoskeletal  (+) Arthritis ,   Abdominal (+) + obese,   Peds negative pediatric ROS (+)  Hematology negative hematology ROS (+) Blood dyscrasia, anemia ,   Anesthesia Other Findings Past Medical History: No date: Anemia     Comment:  H/O No date: Anxiety No date: Arthritis No date: Cancer ([place])     Comment:  SKIN CANCER-MELANOMA No date: Complication of anesthesia No date: Diabetes mellitus without complication (HCC) No date: Family history of adverse reaction to anesthesia     Comment:  MOM-BP DROPPED VERY LOW No date: Hypertension No date: PONV (postoperative nausea and vomiting)  Past Surgical History: No date: ABDOMINAL HYSTERECTOMY No date: CHOLECYSTECTOMY 03/02/2020: REVERSE SHOULDER ARTHROPLASTY; Right     Comment:  Procedure: REVERSE SHOULDER ARTHROPLASTY;  Surgeon:               Corky Mull, MD;  Location: [facility] ORS;  Service:               Orthopedics;  Laterality: Right; No date: TUBAL LIGATION  BMI    Body Mass Index: 45.60 kg/m      Reproductive/Obstetrics negative OB ROS                              Anesthesia Physical Anesthesia Plan  ASA: 3  Anesthesia Plan: General   Post-op Pain Management:    Induction: Intravenous  PONV Risk Score and Plan: 1 and Ondansetron  Airway Management Planned: Nasal Cannula  Additional Equipment:   Intra-op Plan:   Post-operative Plan:   Informed Consent: I have reviewed the patients History and Physical, chart, labs and discussed the procedure including the risks, benefits and alternatives for the proposed anesthesia with the patient or authorized representative who has indicated his/her understanding and acceptance.     Dental Advisory Given  Plan Discussed with: CRNA and Surgeon  Anesthesia Plan Comments:         Anesthesia Quick Evaluation

## 2021-05-02 NOTE — Op Note (Signed)
[facility] Gastroenterology Patient Name: Felicia Lane Procedure Date: 05/02/2021 10:09 AM MRN: [id number] Account #: [id number] Date of Birth: [birth year] Admit Type: Outpatient Age: 65 Room: [facility] ENDO ROOM 3 Gender: Female Note Status: Finalized Instrument Name: Jasper Riling [id number] Procedure:             Colonoscopy Indications:           Screening for colorectal malignant neoplasm Providers:             Gabrielle Mester B. Bonna Gains MD, MD Referring MD:          Theotis Burrow (Referring MD) Medicines:             Monitored Anesthesia Care Complications:         No immediate complications. Procedure:             Pre-Anesthesia Assessment:                        - ASA Grade Assessment: II - A patient with mild                         systemic disease.                        - Prior to the procedure, a History and Physical was                         performed, and patient medications, allergies and                         sensitivities were reviewed. The patient's tolerance                         of previous anesthesia was reviewed.                        - The risks and benefits of the procedure and the                         sedation options and risks were discussed with the                         patient. All questions were answered and informed                         consent was obtained.                        - Patient identification and proposed procedure were                         verified prior to the procedure by the physician, the                         nurse, the anesthesiologist, the anesthetist and the                         technician. The procedure was verified in the  procedure room.                        After obtaining informed consent, the colonoscope was                         passed under direct vision. Throughout the procedure,                         the patient's blood pressure, pulse, and oxygen                          saturations were monitored continuously. The                         Colonoscope was introduced through the anus and                         advanced to the the cecum, identified by appendiceal                         orifice and ileocecal valve. The colonoscopy was                         performed with ease. The patient tolerated the                         procedure well. The quality of the bowel preparation                         was fair. Findings:      The perianal and digital rectal examinations were normal.      Two sessile polyps were found in the transverse colon. The polyps were 4       to 5 mm in size. These polyps were removed with a cold snare. Resection       and retrieval were complete.      The exam was otherwise without abnormality.      The rectum, sigmoid colon, descending colon, transverse colon, ascending       colon and cecum appeared normal.      Non-bleeding internal hemorrhoids were found during retroflexion.      No additional abnormalities were found on retroflexion. Impression:            - Preparation of the colon was fair.                        - Two 4 to 5 mm polyps in the transverse colon,                         removed with a cold snare. Resected and retrieved.                        - The examination was otherwise normal.                        - The rectum, sigmoid colon, descending colon,                         transverse colon, ascending  colon and cecum are normal.                        - Non-bleeding internal hemorrhoids. Recommendation:        - Discharge patient to home (with escort).                        - High fiber diet.                        - Advance diet as tolerated.                        - Continue present medications.                        - Await pathology results.                        - Repeat colonoscopy date to be determined after                         pending pathology results are reviewed.                         - The findings and recommendations were discussed with                         the patient.                        - The findings and recommendations were discussed with                         the patient's family.                        - Return to primary care physician as previously                         scheduled. Procedure Code(s):     --- Professional ---                        [phone], Colonoscopy, flexible; with removal of                         tumor(s), polyp(s), or other lesion(s) by snare                         technique Diagnosis Code(s):     --- Professional ---                        Z12.11, Encounter for screening for malignant neoplasm                         of colon                        K63.5, Polyp of colon CPT copyright 2019 American Medical Association. All rights reserved. The codes documented in this report are preliminary and upon coder review may  be revised to  meet current compliance requirements.  Vonda Antigua, MD Margretta Sidle B. Bonna Gains MD, MD 05/02/2021 11:01:21 AM This report has been signed electronically. Number of Addenda: 0 Note Initiated On: 05/02/2021 10:09 AM Scope Withdrawal Time: 0 hours 19 minutes 38 seconds  Total Procedure Duration: 0 hours 27 minutes 11 seconds  Estimated Blood Loss:  Estimated blood loss: none.      [facility]

## 2021-05-02 NOTE — Transfer of Care (Signed)
Immediate Anesthesia Transfer of Care Note  Patient: Felicia Lane  Procedure(s) Performed: COLONOSCOPY WITH PROPOFOL  Patient Location: Endoscopy Unit  Anesthesia Type:General  Level of Consciousness: awake, alert  and oriented  Airway & Oxygen Therapy: Patient Spontanous Breathing  Post-op Assessment: Report given to RN and Post -op Vital signs reviewed and stable  Post vital signs: Reviewed  Last Vitals:  Vitals Value Taken Time  BP    Temp    Pulse 85 05/02/21 1058  Resp 21 05/02/21 1058  SpO2 99 % 05/02/21 1058  Vitals shown include unvalidated device data.  Last Pain:  Vitals:   05/02/21 1003  TempSrc: Temporal  PainSc: 0-No pain         Complications: No notable events documented.

## 2021-05-02 NOTE — H&P (Signed)
Vonda Antigua, MD [address], [place], [place], [place], [address] [address], [place], [place], [place], [address] Phone: [phone]  Fax: [phone]  Primary Care Physician:  Theotis Burrow, MD   Pre-Procedure History & Physical: HPI:  Felicia Lane is a 65 y.o. female is here for a colonoscopy.   Past Medical History:  Diagnosis Date   Anemia    H/O   Anxiety    Arthritis    Cancer ([place])    SKIN CANCER-MELANOMA   Complication of anesthesia    Diabetes mellitus without complication ([place])    Family history of adverse reaction to anesthesia    MOM-BP DROPPED VERY LOW   Hypertension    PONV (postoperative nausea and vomiting)     Past Surgical History:  Procedure Laterality Date   ABDOMINAL HYSTERECTOMY     CHOLECYSTECTOMY     REVERSE SHOULDER ARTHROPLASTY Right 03/02/2020   Procedure: REVERSE SHOULDER ARTHROPLASTY;  Surgeon: Corky Mull, MD;  Location: [facility] ORS;  Service: Orthopedics;  Laterality: Right;   TUBAL LIGATION      Prior to Admission medications   Medication Sig Start Date End Date Taking? Authorizing Provider  acetaminophen (TYLENOL) 500 MG tablet Take 500-1,000 mg by mouth every 6 (six) hours as needed (for pain.).   Yes [provider]  allopurinol (ZYLOPRIM) 100 MG tablet Take 100 mg by mouth at bedtime. 02/11/20  Yes [provider]  atorvastatin (LIPITOR) 40 MG tablet Take 40 mg by mouth every evening. 12/29/19  Yes [provider]  baclofen (LIORESAL) 10 MG tablet Take 10 mg by mouth 3 (three) times daily. 10/02/20  Yes [provider]  cetirizine (ZYRTEC) 10 MG tablet Take 10 mg by mouth daily.   Yes [provider]  Cholecalciferol (VITAMIN D3 PO) Take 1 tablet by mouth daily.   Yes [provider]  Cyanocobalamin (VITAMIN B-12 PO) Take 1 tablet by mouth daily.   Yes [provider]  DULoxetine (CYMBALTA) 30 MG capsule Take 60 mg by mouth at bedtime. 01/12/20   Yes [provider]  erythromycin ophthalmic ointment erythromycin 5 mg/gram (0.5 %) eye ointment   Yes [provider]  furosemide (LASIX) 20 MG tablet Take 20 mg by mouth daily. In the morning. 01/12/20  Yes [provider]  gabapentin (NEURONTIN) 600 MG tablet Take 600 mg by mouth every 8 (eight) hours. 02/15/20  Yes [provider]  glipiZIDE (GLUCOTROL) 10 MG tablet Take 10 mg by mouth 2 (two) times daily. 01/27/20  Yes [provider]  Glucosamine-Chondroitin (COSAMIN DS PO) Take 1 tablet by mouth at bedtime.   Yes [provider]  hydrochlorothiazide (HYDRODIURIL) 25 MG tablet Take 25 mg by mouth at bedtime. 01/20/20  Yes [provider]  lisinopril (ZESTRIL) 10 MG tablet Take 10 mg by mouth at bedtime. 10/14/19  Yes [provider]  meloxicam (MOBIC) 15 MG tablet Take 15 mg by mouth every evening. 02/10/20  Yes [provider]  metFORMIN (GLUCOPHAGE-XR) 500 MG 24 hr tablet Take 500 mg by mouth 2 (two) times daily. 01/21/20  Yes [provider]  Multiple Vitamin (MULTIVITAMIN WITH MINERALS) TABS tablet Take 1 tablet by mouth at bedtime.   Yes [provider]  naproxen (NAPROSYN) 500 MG tablet Take 500 mg by mouth 2 (two) times daily. 10/02/20  Yes [provider]  Omega-3 Fatty Acids (FISH OIL) 1200 MG CAPS Take 1,200 mg by mouth at bedtime.   Yes [provider]  ondansetron (ZOFRAN-ODT) 4 MG disintegrating tablet Take 4 mg by mouth every 8 (eight) hours as needed. 10/02/20  Yes [provider]  OZEMPIC, 0.25 OR 0.5 MG/DOSE, 2 MG/1.5ML SOPN Inject into the skin. 09/21/20  Yes [provider]  sitaGLIPtin (JANUVIA) 100 MG tablet Take 100 mg by mouth at bedtime.   Yes [provider]    Allergies as of 03/30/2021 - Review Complete 10/05/2020  Allergen Reaction Noted   Codeine Itching 10/16/2014   Shellfish allergy Nausea Only and Other (See Comments) 02/17/2020     Family History  Problem Relation Age of Onset   Breast cancer Neg Hx     Social History   Socioeconomic History   Marital status: Widowed    Spouse name: Not on file   Number of children: Not on file   Years of education: Not on file   Highest education level: Not on file  Occupational History   Not on file  Tobacco Use   Smoking status: Never   Smokeless tobacco: Never  Vaping Use   Vaping Use: Never used  Substance and Sexual Activity   Alcohol use: Not Currently   Drug use: Never   Sexual activity: Not on file  Other Topics Concern   Not on file  Social History Narrative   Not on file   Social Determinants of Health   Financial Resource Strain: Not on file  Food Insecurity: Not on file  Transportation Needs: Not on file  Physical Activity: Not on file  Stress: Not on file  Social Connections: Not on file  Intimate Partner Violence: Not on file    Review of Systems: See HPI, otherwise negative ROS  Physical Exam: Constitutional: General:   Alert,  Well-developed, well-nourished, pleasant and cooperative in NAD BP (!) 153/89   Pulse 94   Temp (!) 97.2 F (36.2 C) (Temporal)   Resp 20   Ht 5\' 5"  (1.651 m)   Wt 124.3 kg   SpO2 100%   BMI 45.60 kg/m   Head: Normocephalic, atraumatic.   Eyes:  Sclera clear, no icterus.   Conjunctiva pink.   Mouth:  No deformity or lesions, oropharynx pink & moist.  Neck:  Supple, trachea midline  Respiratory: Normal respiratory effort  Gastrointestinal:  Soft, non-tender and non-distended without masses, hepatosplenomegaly or hernias noted.  No guarding or rebound tenderness.     Cardiac: No clubbing or edema.  No cyanosis. Normal posterior tibial pedal pulses noted.  Lymphatic:  No significant cervical adenopathy.  Psych:  Alert and cooperative. Normal mood and affect.  Musculoskeletal:   Symmetrical without gross deformities. 5/5 Lower extremity strength bilaterally.  Skin: Warm. Intact without  significant lesions or rashes. No jaundice.  Neurologic:  Face symmetrical, tongue midline, Normal sensation to touch;  grossly normal neurologically.  Psych:  Alert and oriented x3, Alert and cooperative. Normal mood and affect.  Impression/Plan: Wahneta Derocher Bodkin is here for a colonoscopy to be performed for average risk screening.  Risks, benefits, limitations, and alternatives regarding  colonoscopy have been reviewed with the patient.  Questions have been answered.  All parties agreeable.   Virgel Manifold, MD  05/02/2021, 10:07 AM

## 2021-05-02 NOTE — Anesthesia Postprocedure Evaluation (Signed)
Anesthesia Post Note  Patient: Felicia Lane  Procedure(s) Performed: COLONOSCOPY WITH PROPOFOL  Patient location during evaluation: PACU Anesthesia Type: General Level of consciousness: awake and awake and alert Pain management: pain level controlled Vital Signs Assessment: post-procedure vital signs reviewed and stable Respiratory status: spontaneous breathing and nonlabored ventilation Cardiovascular status: blood pressure returned to baseline Anesthetic complications: no   No notable events documented.   Last Vitals:  Vitals:   05/02/21 1003 05/02/21 1059  BP: (!) 153/89 121/60  Pulse: 94   Resp: 20   Temp: (!) 36.2 C (!) 36.3 C  SpO2: 100%     Last Pain:  Vitals:   05/02/21 1059  TempSrc: Temporal  PainSc: 0-No pain                 VAN STAVEREN,Makael Stein

## 2021-05-03 ENCOUNTER — Encounter: Payer: Self-pay | Admitting: Gastroenterology

## 2021-05-03 LAB — SURGICAL PATHOLOGY

## 2022-08-06 DIAGNOSIS — R051 Acute cough: Secondary | ICD-10-CM | POA: Diagnosis not present

## 2022-08-06 DIAGNOSIS — R062 Wheezing: Secondary | ICD-10-CM | POA: Diagnosis not present

## 2022-08-06 DIAGNOSIS — J189 Pneumonia, unspecified organism: Secondary | ICD-10-CM | POA: Diagnosis not present

## 2022-08-06 DIAGNOSIS — R059 Cough, unspecified: Secondary | ICD-10-CM | POA: Diagnosis not present

## 2022-08-06 DIAGNOSIS — Z03818 Encounter for observation for suspected exposure to other biological agents ruled out: Secondary | ICD-10-CM | POA: Diagnosis not present

## 2022-08-14 DIAGNOSIS — E782 Mixed hyperlipidemia: Secondary | ICD-10-CM | POA: Diagnosis not present

## 2022-08-14 DIAGNOSIS — E1169 Type 2 diabetes mellitus with other specified complication: Secondary | ICD-10-CM | POA: Diagnosis not present

## 2022-08-14 DIAGNOSIS — E785 Hyperlipidemia, unspecified: Secondary | ICD-10-CM | POA: Diagnosis not present

## 2022-08-14 DIAGNOSIS — N1832 Chronic kidney disease, stage 3b: Secondary | ICD-10-CM | POA: Diagnosis not present

## 2022-09-11 DIAGNOSIS — I5032 Chronic diastolic (congestive) heart failure: Secondary | ICD-10-CM | POA: Diagnosis not present

## 2022-09-11 DIAGNOSIS — R0609 Other forms of dyspnea: Secondary | ICD-10-CM | POA: Diagnosis not present

## 2022-09-11 DIAGNOSIS — G4733 Obstructive sleep apnea (adult) (pediatric): Secondary | ICD-10-CM | POA: Diagnosis not present

## 2022-09-11 DIAGNOSIS — J9612 Chronic respiratory failure with hypercapnia: Secondary | ICD-10-CM | POA: Diagnosis not present

## 2022-11-12 DIAGNOSIS — E785 Hyperlipidemia, unspecified: Secondary | ICD-10-CM | POA: Diagnosis not present

## 2022-11-12 DIAGNOSIS — E1169 Type 2 diabetes mellitus with other specified complication: Secondary | ICD-10-CM | POA: Diagnosis not present

## 2022-11-12 DIAGNOSIS — N1832 Chronic kidney disease, stage 3b: Secondary | ICD-10-CM | POA: Diagnosis not present

## 2022-11-12 DIAGNOSIS — E1122 Type 2 diabetes mellitus with diabetic chronic kidney disease: Secondary | ICD-10-CM | POA: Diagnosis not present

## 2023-01-01 IMAGING — CT CT HEAD W/O CM
3 series · 15 of 47 positions shown, 18 images · non-contrast
Comparison: None.

CLINICAL DATA: Triage note: Pt comes into the ED via EMS from home,
states her daughter found her in the floor, pt is unable to recall
if she passed out an hour ago or 40hrs ago. States she has been
feeling weak and disoriented since yesterday.Pt c/o lower back pain
and neck pain. States she is starting to remember falling going to
the BR this morning, states she fell hitting the side of the tube,
hitting her head. States she remembers her grandson getting up to go
to the gym this morningPt is disoriented to time, and place

EXAM:
CT HEAD WITHOUT CONTRAST
CT CERVICAL SPINE WITHOUT CONTRAST
TECHNIQUE: Multidetector CT imaging of the head and cervical spine was
performed following the standard protocol without intravenous
contrast. Multiplanar CT image reconstructions of the cervical spine
were also generated.

[Series 2: head wo · axial · 0.46mm/px · z∈[-122,+3]mm · 9 of 31 slices shown, 12 images]
[im 3/31  brain]
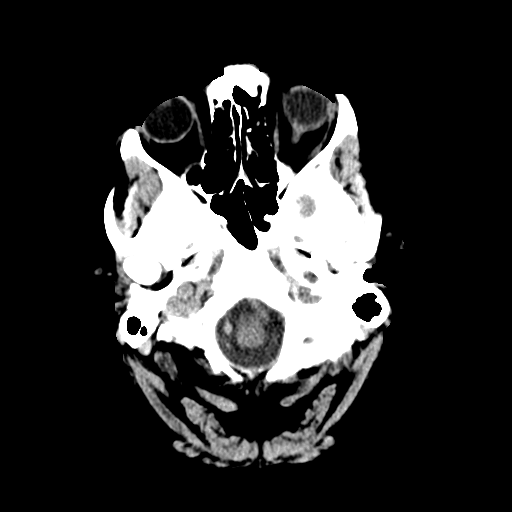
[im 3/31  bone]
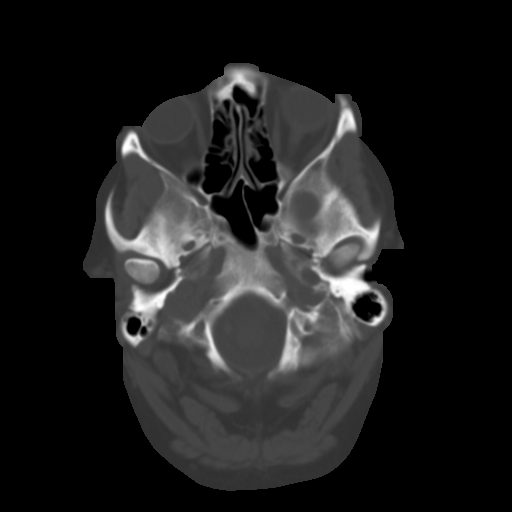
[im 6/31  brain]
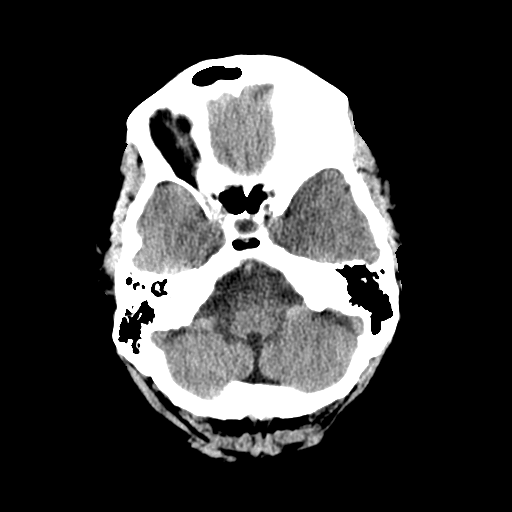
[im 9/31  brain]
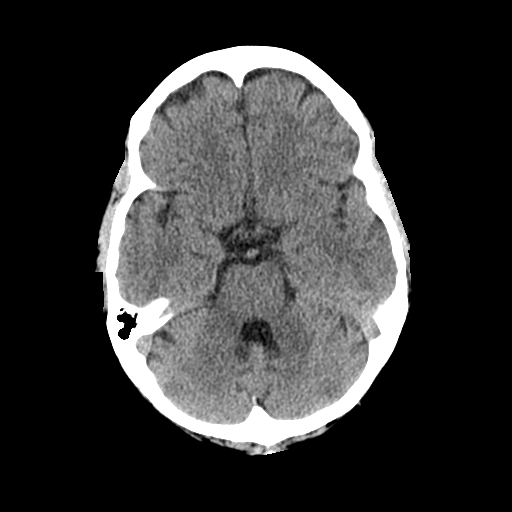
[im 12/31  brain]
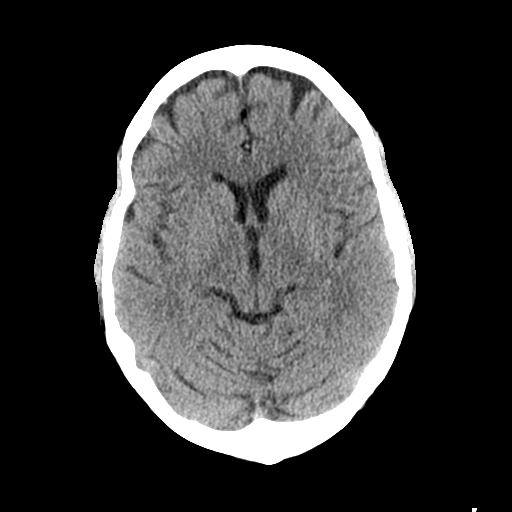
[im 16/31  brain]
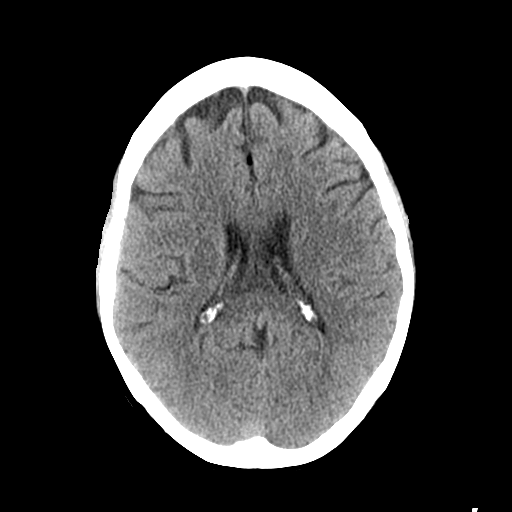
[im 16/31  bone]
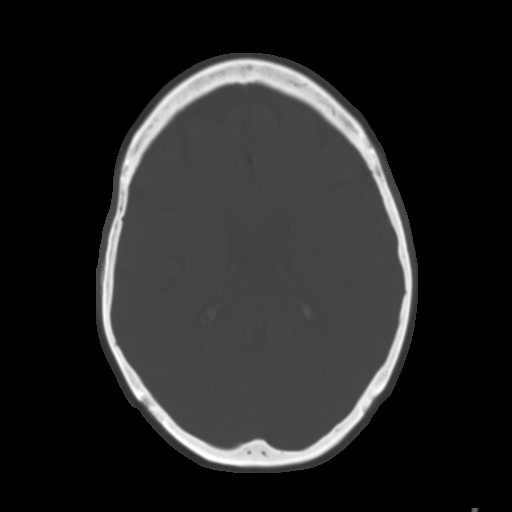
[im 19/31  brain]
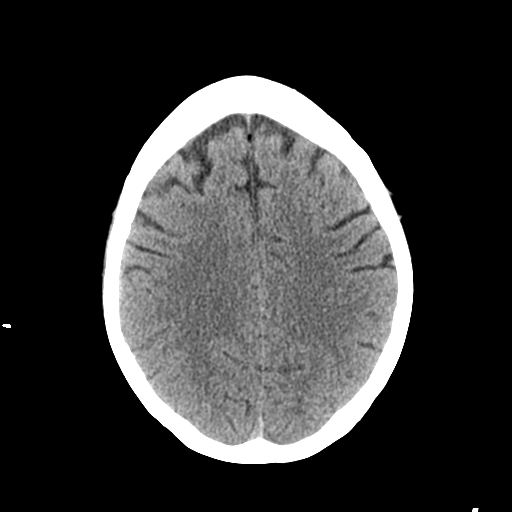
[im 22/31  brain]
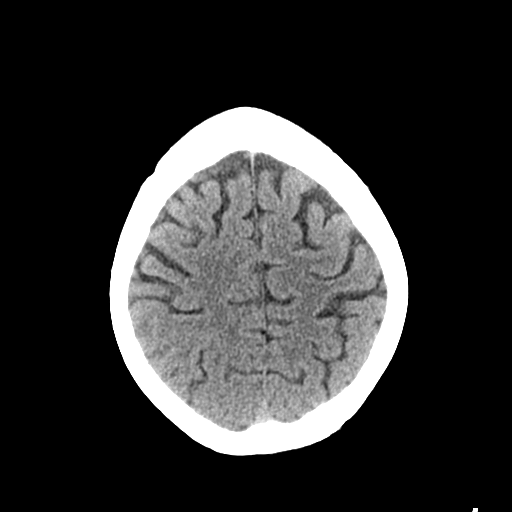
[im 25/31  brain]
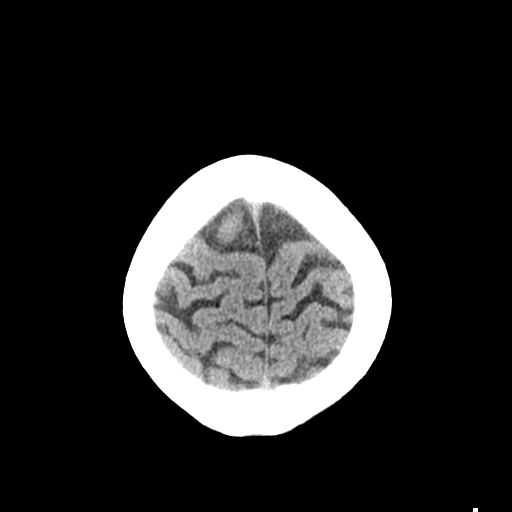
[im 28/31  brain]
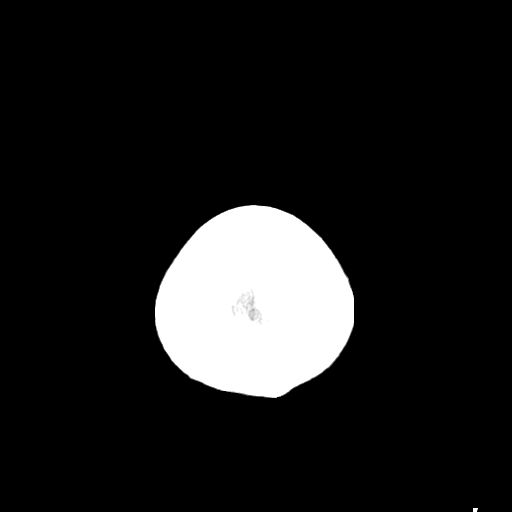
[im 28/31  bone]
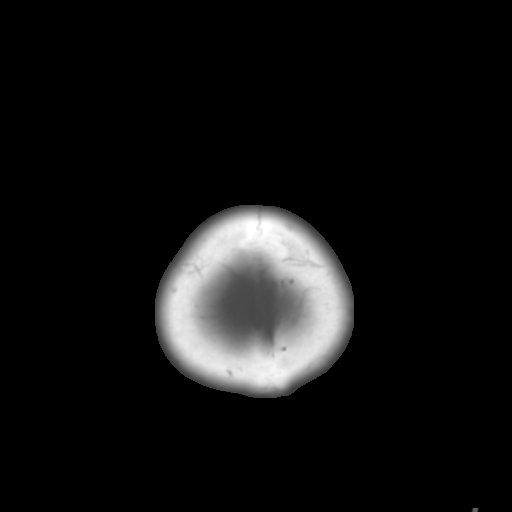

[Series 4: coronal soft tissue · coronal · 0.34mm/px · 3 of 65 slices shown]
[im 22/65  brain]
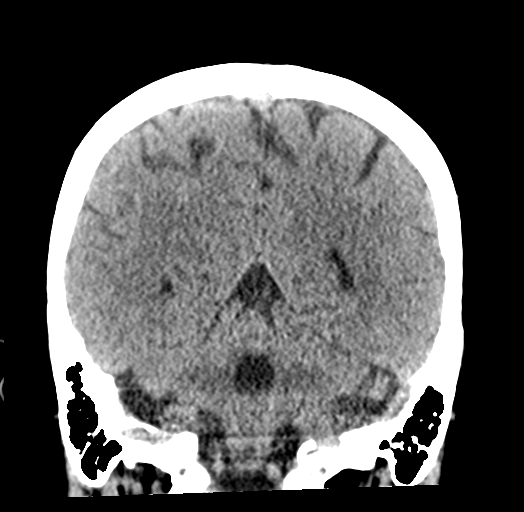
[im 29/65  brain]
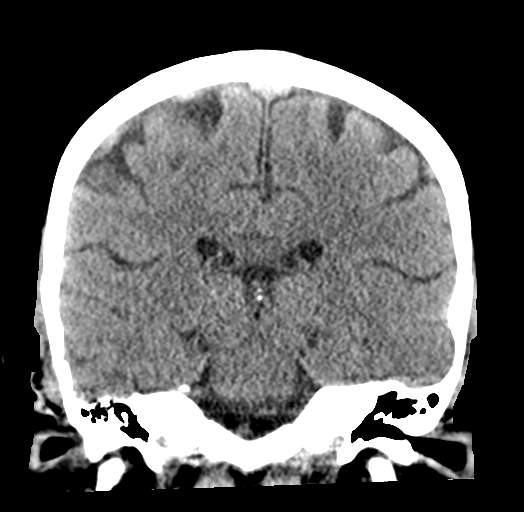
[im 36/65  brain]
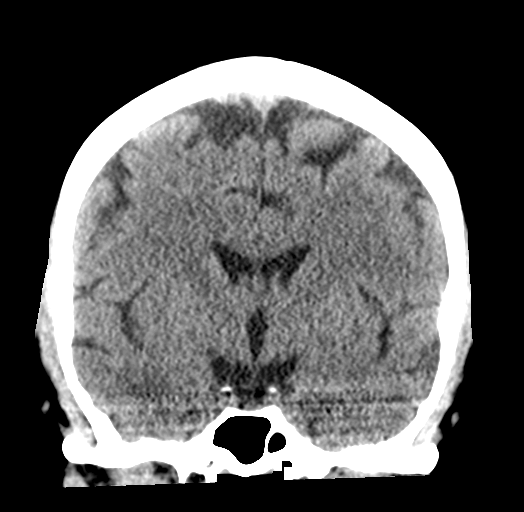

[Series 5: sagittal soft tissue · sagittal · 0.33mm/px · 3 of 56 slices shown]
[im 19/56  brain]
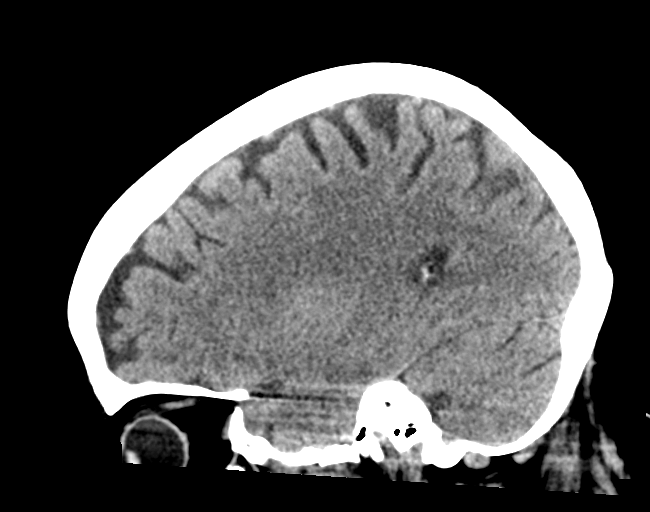
[im 28/56  brain]
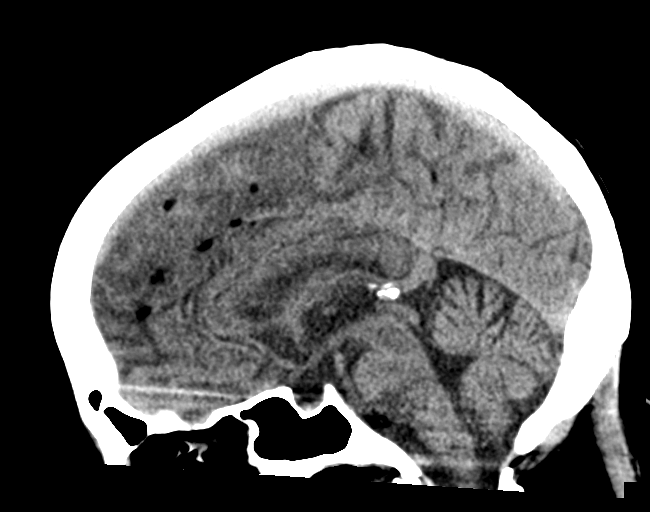
[im 37/56  brain]
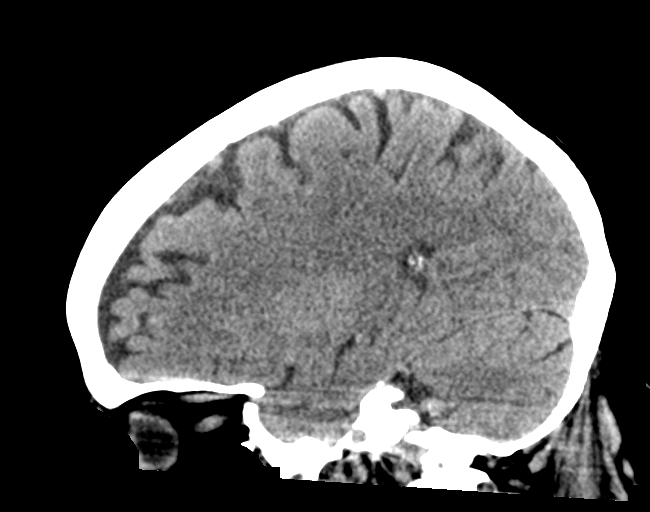

[15 of 47 positions shown; findings below may reference images not displayed]

FINDINGS: CT HEAD FINDINGS

Brain: No evidence of acute infarction, hemorrhage, hydrocephalus,
extra-axial collection or mass lesion/mass effect.

Vascular: No hyperdense vessel or unexpected calcification.

Skull: Normal. Negative for fracture or focal lesion.

Sinuses/Orbits: Visualized globes and orbits are unremarkable. The
visualized sinuses are clear.

Other: None.

CT CERVICAL SPINE FINDINGS

Alignment: Reversal of the normal cervical lordosis, apex at C6. No
spondylolisthesis.

Skull base and vertebrae: No acute fracture. No primary bone lesion
or focal pathologic process.

Soft tissues and spinal canal: No prevertebral fluid or swelling. No
visible canal hematoma.

Disc levels: Moderate loss of disc height endplate sclerosis and
spurring and disc bulging at C5-C6 and C6-C7. Remaining cervical
disc levels are well preserved. No evidence of a disc herniation. No
significant stenosis.

Upper chest: Negative.

Other: None.
IMPRESSION: HEAD CT

1. Normal.

CERVICAL CT

1. No fracture or acute finding.
2. Reversed cervical lordosis. Disc degenerative changes at C5-C6
and C6-C7.

## 2023-01-21 ENCOUNTER — Other Ambulatory Visit: Payer: Self-pay

## 2023-03-03 DIAGNOSIS — H2513 Age-related nuclear cataract, bilateral: Secondary | ICD-10-CM | POA: Diagnosis not present

## 2023-03-03 DIAGNOSIS — Z0100 Encounter for examination of eyes and vision without abnormal findings: Secondary | ICD-10-CM | POA: Diagnosis not present

## 2023-03-03 DIAGNOSIS — H0100A Unspecified blepharitis right eye, upper and lower eyelids: Secondary | ICD-10-CM | POA: Diagnosis not present

## 2023-03-03 DIAGNOSIS — E119 Type 2 diabetes mellitus without complications: Secondary | ICD-10-CM | POA: Diagnosis not present

## 2023-03-03 DIAGNOSIS — H43813 Vitreous degeneration, bilateral: Secondary | ICD-10-CM | POA: Diagnosis not present

## 2023-03-19 DIAGNOSIS — E785 Hyperlipidemia, unspecified: Secondary | ICD-10-CM | POA: Diagnosis not present

## 2023-03-19 DIAGNOSIS — G4733 Obstructive sleep apnea (adult) (pediatric): Secondary | ICD-10-CM | POA: Diagnosis not present

## 2023-03-19 DIAGNOSIS — E1142 Type 2 diabetes mellitus with diabetic polyneuropathy: Secondary | ICD-10-CM | POA: Diagnosis not present

## 2023-03-19 DIAGNOSIS — Z23 Encounter for immunization: Secondary | ICD-10-CM | POA: Diagnosis not present

## 2023-03-19 DIAGNOSIS — N1832 Chronic kidney disease, stage 3b: Secondary | ICD-10-CM | POA: Diagnosis not present

## 2023-03-19 DIAGNOSIS — E782 Mixed hyperlipidemia: Secondary | ICD-10-CM | POA: Diagnosis not present

## 2023-03-19 DIAGNOSIS — E1169 Type 2 diabetes mellitus with other specified complication: Secondary | ICD-10-CM | POA: Diagnosis not present

## 2023-03-19 DIAGNOSIS — I10 Essential (primary) hypertension: Secondary | ICD-10-CM | POA: Diagnosis not present

## 2023-04-09 DIAGNOSIS — Z1331 Encounter for screening for depression: Secondary | ICD-10-CM | POA: Diagnosis not present

## 2023-04-09 DIAGNOSIS — Z0000 Encounter for general adult medical examination without abnormal findings: Secondary | ICD-10-CM | POA: Diagnosis not present

## 2023-04-09 DIAGNOSIS — E875 Hyperkalemia: Secondary | ICD-10-CM | POA: Diagnosis not present

## 2023-04-09 DIAGNOSIS — E119 Type 2 diabetes mellitus without complications: Secondary | ICD-10-CM | POA: Diagnosis not present

## 2023-04-09 DIAGNOSIS — Z6841 Body Mass Index (BMI) 40.0 and over, adult: Secondary | ICD-10-CM | POA: Diagnosis not present

## 2023-04-09 DIAGNOSIS — D649 Anemia, unspecified: Secondary | ICD-10-CM | POA: Diagnosis not present

## 2023-04-09 DIAGNOSIS — E781 Pure hyperglyceridemia: Secondary | ICD-10-CM | POA: Diagnosis not present

## 2023-07-03 DIAGNOSIS — I10 Essential (primary) hypertension: Secondary | ICD-10-CM | POA: Diagnosis not present

## 2023-07-03 DIAGNOSIS — E1169 Type 2 diabetes mellitus with other specified complication: Secondary | ICD-10-CM | POA: Diagnosis not present

## 2023-07-03 DIAGNOSIS — E782 Mixed hyperlipidemia: Secondary | ICD-10-CM | POA: Diagnosis not present

## 2023-07-03 DIAGNOSIS — E785 Hyperlipidemia, unspecified: Secondary | ICD-10-CM | POA: Diagnosis not present

## 2023-07-03 DIAGNOSIS — Z862 Personal history of diseases of the blood and blood-forming organs and certain disorders involving the immune mechanism: Secondary | ICD-10-CM | POA: Diagnosis not present

## 2023-07-10 DIAGNOSIS — E785 Hyperlipidemia, unspecified: Secondary | ICD-10-CM | POA: Diagnosis not present

## 2023-07-10 DIAGNOSIS — E1169 Type 2 diabetes mellitus with other specified complication: Secondary | ICD-10-CM | POA: Diagnosis not present

## 2023-07-31 DIAGNOSIS — E785 Hyperlipidemia, unspecified: Secondary | ICD-10-CM | POA: Diagnosis not present

## 2023-07-31 DIAGNOSIS — E782 Mixed hyperlipidemia: Secondary | ICD-10-CM | POA: Diagnosis not present

## 2023-07-31 DIAGNOSIS — E1122 Type 2 diabetes mellitus with diabetic chronic kidney disease: Secondary | ICD-10-CM | POA: Diagnosis not present

## 2023-07-31 DIAGNOSIS — N1832 Chronic kidney disease, stage 3b: Secondary | ICD-10-CM | POA: Diagnosis not present

## 2023-07-31 DIAGNOSIS — I129 Hypertensive chronic kidney disease with stage 1 through stage 4 chronic kidney disease, or unspecified chronic kidney disease: Secondary | ICD-10-CM | POA: Diagnosis not present

## 2023-07-31 DIAGNOSIS — L989 Disorder of the skin and subcutaneous tissue, unspecified: Secondary | ICD-10-CM | POA: Diagnosis not present

## 2023-07-31 DIAGNOSIS — E1169 Type 2 diabetes mellitus with other specified complication: Secondary | ICD-10-CM | POA: Diagnosis not present

## 2023-07-31 DIAGNOSIS — Z6841 Body Mass Index (BMI) 40.0 and over, adult: Secondary | ICD-10-CM | POA: Diagnosis not present

## 2023-08-14 DIAGNOSIS — I10 Essential (primary) hypertension: Secondary | ICD-10-CM | POA: Diagnosis not present

## 2023-08-14 DIAGNOSIS — N1832 Chronic kidney disease, stage 3b: Secondary | ICD-10-CM | POA: Diagnosis not present

## 2023-08-14 DIAGNOSIS — E785 Hyperlipidemia, unspecified: Secondary | ICD-10-CM | POA: Diagnosis not present

## 2023-08-14 DIAGNOSIS — E1169 Type 2 diabetes mellitus with other specified complication: Secondary | ICD-10-CM | POA: Diagnosis not present

## 2023-10-02 DIAGNOSIS — L820 Inflamed seborrheic keratosis: Secondary | ICD-10-CM | POA: Diagnosis not present

## 2023-10-02 DIAGNOSIS — L821 Other seborrheic keratosis: Secondary | ICD-10-CM | POA: Diagnosis not present

## 2023-10-02 DIAGNOSIS — D485 Neoplasm of uncertain behavior of skin: Secondary | ICD-10-CM | POA: Diagnosis not present

## 2023-10-02 DIAGNOSIS — D2262 Melanocytic nevi of left upper limb, including shoulder: Secondary | ICD-10-CM | POA: Diagnosis not present

## 2023-10-02 DIAGNOSIS — D045 Carcinoma in situ of skin of trunk: Secondary | ICD-10-CM | POA: Diagnosis not present

## 2023-10-02 DIAGNOSIS — D2272 Melanocytic nevi of left lower limb, including hip: Secondary | ICD-10-CM | POA: Diagnosis not present

## 2023-10-02 DIAGNOSIS — D2271 Melanocytic nevi of right lower limb, including hip: Secondary | ICD-10-CM | POA: Diagnosis not present

## 2023-10-02 DIAGNOSIS — L570 Actinic keratosis: Secondary | ICD-10-CM | POA: Diagnosis not present

## 2023-10-02 DIAGNOSIS — D2261 Melanocytic nevi of right upper limb, including shoulder: Secondary | ICD-10-CM | POA: Diagnosis not present

## 2023-10-02 DIAGNOSIS — D2362 Other benign neoplasm of skin of left upper limb, including shoulder: Secondary | ICD-10-CM | POA: Diagnosis not present

## 2023-10-02 DIAGNOSIS — D0472 Carcinoma in situ of skin of left lower limb, including hip: Secondary | ICD-10-CM | POA: Diagnosis not present

## 2023-10-02 DIAGNOSIS — D225 Melanocytic nevi of trunk: Secondary | ICD-10-CM | POA: Diagnosis not present

## 2023-11-07 DIAGNOSIS — E1169 Type 2 diabetes mellitus with other specified complication: Secondary | ICD-10-CM | POA: Diagnosis not present

## 2023-11-07 DIAGNOSIS — E785 Hyperlipidemia, unspecified: Secondary | ICD-10-CM | POA: Diagnosis not present

## 2023-11-07 DIAGNOSIS — E782 Mixed hyperlipidemia: Secondary | ICD-10-CM | POA: Diagnosis not present

## 2023-11-14 DIAGNOSIS — E1122 Type 2 diabetes mellitus with diabetic chronic kidney disease: Secondary | ICD-10-CM | POA: Diagnosis not present

## 2023-11-14 DIAGNOSIS — E1142 Type 2 diabetes mellitus with diabetic polyneuropathy: Secondary | ICD-10-CM | POA: Diagnosis not present

## 2023-11-14 DIAGNOSIS — Z6841 Body Mass Index (BMI) 40.0 and over, adult: Secondary | ICD-10-CM | POA: Diagnosis not present

## 2023-11-14 DIAGNOSIS — Z1159 Encounter for screening for other viral diseases: Secondary | ICD-10-CM | POA: Diagnosis not present

## 2023-11-14 DIAGNOSIS — E66813 Obesity, class 3: Secondary | ICD-10-CM | POA: Diagnosis not present

## 2023-11-14 DIAGNOSIS — E782 Mixed hyperlipidemia: Secondary | ICD-10-CM | POA: Diagnosis not present

## 2023-11-14 DIAGNOSIS — I129 Hypertensive chronic kidney disease with stage 1 through stage 4 chronic kidney disease, or unspecified chronic kidney disease: Secondary | ICD-10-CM | POA: Diagnosis not present

## 2023-11-14 DIAGNOSIS — N1832 Chronic kidney disease, stage 3b: Secondary | ICD-10-CM | POA: Diagnosis not present

## 2023-11-27 DIAGNOSIS — D045 Carcinoma in situ of skin of trunk: Secondary | ICD-10-CM | POA: Diagnosis not present

## 2023-11-27 DIAGNOSIS — L2989 Other pruritus: Secondary | ICD-10-CM | POA: Diagnosis not present

## 2023-11-27 DIAGNOSIS — D0472 Carcinoma in situ of skin of left lower limb, including hip: Secondary | ICD-10-CM | POA: Diagnosis not present

## 2023-11-27 DIAGNOSIS — L820 Inflamed seborrheic keratosis: Secondary | ICD-10-CM | POA: Diagnosis not present

## 2023-11-27 DIAGNOSIS — L538 Other specified erythematous conditions: Secondary | ICD-10-CM | POA: Diagnosis not present

## 2023-12-12 DIAGNOSIS — E785 Hyperlipidemia, unspecified: Secondary | ICD-10-CM | POA: Diagnosis not present

## 2023-12-12 DIAGNOSIS — E1169 Type 2 diabetes mellitus with other specified complication: Secondary | ICD-10-CM | POA: Diagnosis not present

## 2023-12-12 DIAGNOSIS — E1165 Type 2 diabetes mellitus with hyperglycemia: Secondary | ICD-10-CM | POA: Diagnosis not present

## 2023-12-12 DIAGNOSIS — E1159 Type 2 diabetes mellitus with other circulatory complications: Secondary | ICD-10-CM | POA: Diagnosis not present

## 2023-12-12 DIAGNOSIS — I152 Hypertension secondary to endocrine disorders: Secondary | ICD-10-CM | POA: Diagnosis not present

## 2023-12-19 DIAGNOSIS — E785 Hyperlipidemia, unspecified: Secondary | ICD-10-CM | POA: Diagnosis not present

## 2023-12-19 DIAGNOSIS — N1832 Chronic kidney disease, stage 3b: Secondary | ICD-10-CM | POA: Diagnosis not present

## 2023-12-19 DIAGNOSIS — E1169 Type 2 diabetes mellitus with other specified complication: Secondary | ICD-10-CM | POA: Diagnosis not present

## 2023-12-19 DIAGNOSIS — G4733 Obstructive sleep apnea (adult) (pediatric): Secondary | ICD-10-CM | POA: Diagnosis not present

## 2023-12-19 DIAGNOSIS — E1140 Type 2 diabetes mellitus with diabetic neuropathy, unspecified: Secondary | ICD-10-CM | POA: Diagnosis not present

## 2023-12-19 DIAGNOSIS — I129 Hypertensive chronic kidney disease with stage 1 through stage 4 chronic kidney disease, or unspecified chronic kidney disease: Secondary | ICD-10-CM | POA: Diagnosis not present

## 2023-12-19 DIAGNOSIS — E1165 Type 2 diabetes mellitus with hyperglycemia: Secondary | ICD-10-CM | POA: Diagnosis not present

## 2023-12-19 DIAGNOSIS — J301 Allergic rhinitis due to pollen: Secondary | ICD-10-CM | POA: Diagnosis not present

## 2023-12-19 DIAGNOSIS — E1142 Type 2 diabetes mellitus with diabetic polyneuropathy: Secondary | ICD-10-CM | POA: Diagnosis not present

## 2023-12-19 DIAGNOSIS — E1122 Type 2 diabetes mellitus with diabetic chronic kidney disease: Secondary | ICD-10-CM | POA: Diagnosis not present

## 2024-02-13 DIAGNOSIS — I10 Essential (primary) hypertension: Secondary | ICD-10-CM | POA: Diagnosis not present

## 2024-02-13 DIAGNOSIS — E1169 Type 2 diabetes mellitus with other specified complication: Secondary | ICD-10-CM | POA: Diagnosis not present

## 2024-02-13 DIAGNOSIS — N1832 Chronic kidney disease, stage 3b: Secondary | ICD-10-CM | POA: Diagnosis not present

## 2024-02-13 DIAGNOSIS — Z1159 Encounter for screening for other viral diseases: Secondary | ICD-10-CM | POA: Diagnosis not present

## 2024-02-13 DIAGNOSIS — E785 Hyperlipidemia, unspecified: Secondary | ICD-10-CM | POA: Diagnosis not present

## 2024-02-13 DIAGNOSIS — E782 Mixed hyperlipidemia: Secondary | ICD-10-CM | POA: Diagnosis not present

## 2024-02-20 DIAGNOSIS — Z1331 Encounter for screening for depression: Secondary | ICD-10-CM | POA: Diagnosis not present

## 2024-02-20 DIAGNOSIS — Z0000 Encounter for general adult medical examination without abnormal findings: Secondary | ICD-10-CM | POA: Diagnosis not present

## 2024-02-20 DIAGNOSIS — E1122 Type 2 diabetes mellitus with diabetic chronic kidney disease: Secondary | ICD-10-CM | POA: Diagnosis not present

## 2024-02-20 DIAGNOSIS — N189 Chronic kidney disease, unspecified: Secondary | ICD-10-CM | POA: Diagnosis not present

## 2024-03-01 ENCOUNTER — Other Ambulatory Visit: Payer: Self-pay | Admitting: Family Medicine

## 2024-03-01 DIAGNOSIS — Z1231 Encounter for screening mammogram for malignant neoplasm of breast: Secondary | ICD-10-CM

## 2024-03-05 DIAGNOSIS — H2513 Age-related nuclear cataract, bilateral: Secondary | ICD-10-CM | POA: Diagnosis not present

## 2024-03-05 DIAGNOSIS — Z0100 Encounter for examination of eyes and vision without abnormal findings: Secondary | ICD-10-CM | POA: Diagnosis not present

## 2024-03-05 DIAGNOSIS — H43813 Vitreous degeneration, bilateral: Secondary | ICD-10-CM | POA: Diagnosis not present

## 2024-03-05 DIAGNOSIS — H04123 Dry eye syndrome of bilateral lacrimal glands: Secondary | ICD-10-CM | POA: Diagnosis not present

## 2024-03-08 DIAGNOSIS — G4733 Obstructive sleep apnea (adult) (pediatric): Secondary | ICD-10-CM | POA: Diagnosis not present

## 2024-03-15 DIAGNOSIS — E1159 Type 2 diabetes mellitus with other circulatory complications: Secondary | ICD-10-CM | POA: Diagnosis not present

## 2024-03-15 DIAGNOSIS — E785 Hyperlipidemia, unspecified: Secondary | ICD-10-CM | POA: Diagnosis not present

## 2024-03-15 DIAGNOSIS — E1169 Type 2 diabetes mellitus with other specified complication: Secondary | ICD-10-CM | POA: Diagnosis not present

## 2024-03-15 DIAGNOSIS — Z23 Encounter for immunization: Secondary | ICD-10-CM | POA: Diagnosis not present

## 2024-03-15 DIAGNOSIS — E1165 Type 2 diabetes mellitus with hyperglycemia: Secondary | ICD-10-CM | POA: Diagnosis not present

## 2024-03-15 DIAGNOSIS — I152 Hypertension secondary to endocrine disorders: Secondary | ICD-10-CM | POA: Diagnosis not present

## 2024-03-19 DIAGNOSIS — E66813 Obesity, class 3: Secondary | ICD-10-CM | POA: Diagnosis not present

## 2024-03-19 DIAGNOSIS — J309 Allergic rhinitis, unspecified: Secondary | ICD-10-CM | POA: Diagnosis not present

## 2024-03-26 ENCOUNTER — Ambulatory Visit
Admission: RE | Admit: 2024-03-26 | Discharge: 2024-03-26 | Disposition: A | Discharge status: Home / Self Care | Source: Ambulatory Visit | Attending: Family Medicine | Admitting: Family Medicine

## 2024-03-26 DIAGNOSIS — Z1231 Encounter for screening mammogram for malignant neoplasm of breast: Secondary | ICD-10-CM | POA: Insufficient documentation

## 2024-03-31 DIAGNOSIS — E1169 Type 2 diabetes mellitus with other specified complication: Secondary | ICD-10-CM | POA: Diagnosis not present

## 2024-04-07 NOTE — Progress Notes (Signed)
 Felicia Lane                                          MRN: [id number]   04/07/2024   The [facility] Quality Team Specialist reviewed this patient medical record for the purposes of chart review for care gap closure. The following were reviewed: abstraction for care gap closure-breast cancer screening.    [facility] Quality Team
